# Patient Record
Sex: Female | Born: 1955 | Race: White | Hispanic: No | Marital: Single | Smoking: Never smoker
Health system: Southern US, Community
[De-identification: ages and names within clinical notes are randomized; demographics above are authoritative.]

## PROBLEM LIST (undated history)

## (undated) DIAGNOSIS — R51 Headache: Secondary | ICD-10-CM

## (undated) DIAGNOSIS — F419 Anxiety disorder, unspecified: Secondary | ICD-10-CM

## (undated) DIAGNOSIS — M353 Polymyalgia rheumatica: Secondary | ICD-10-CM

## (undated) DIAGNOSIS — G4730 Sleep apnea, unspecified: Secondary | ICD-10-CM

## (undated) DIAGNOSIS — T8859XA Other complications of anesthesia, initial encounter: Secondary | ICD-10-CM

## (undated) DIAGNOSIS — J45909 Unspecified asthma, uncomplicated: Secondary | ICD-10-CM

## (undated) DIAGNOSIS — Z9889 Other specified postprocedural states: Secondary | ICD-10-CM

## (undated) DIAGNOSIS — S63642A Sprain of metacarpophalangeal joint of left thumb, initial encounter: Secondary | ICD-10-CM

## (undated) DIAGNOSIS — S5332XA Traumatic rupture of left ulnar collateral ligament, initial encounter: Secondary | ICD-10-CM

## (undated) DIAGNOSIS — I499 Cardiac arrhythmia, unspecified: Secondary | ICD-10-CM

## (undated) DIAGNOSIS — Z8679 Personal history of other diseases of the circulatory system: Secondary | ICD-10-CM

## (undated) DIAGNOSIS — K219 Gastro-esophageal reflux disease without esophagitis: Secondary | ICD-10-CM

## (undated) DIAGNOSIS — M1990 Unspecified osteoarthritis, unspecified site: Secondary | ICD-10-CM

## (undated) DIAGNOSIS — T4145XA Adverse effect of unspecified anesthetic, initial encounter: Secondary | ICD-10-CM

## (undated) DIAGNOSIS — R519 Headache, unspecified: Secondary | ICD-10-CM

## (undated) HISTORY — PX: OTHER SURGICAL HISTORY: SHX169

## (undated) HISTORY — PX: NECK SURGERY: SHX720

## (undated) HISTORY — PX: BILATERAL CARPAL TUNNEL RELEASE: SHX6508

## (undated) HISTORY — PX: COLONOSCOPY: SHX174

## (undated) HISTORY — PX: NASAL SINUS SURGERY: SHX719

## (undated) HISTORY — PX: CHOLECYSTECTOMY: SHX55

---

## 2012-06-09 HISTORY — PX: JOINT REPLACEMENT: SHX530

## 2014-06-09 DIAGNOSIS — I499 Cardiac arrhythmia, unspecified: Secondary | ICD-10-CM

## 2014-06-09 HISTORY — DX: Cardiac arrhythmia, unspecified: I49.9

## 2014-06-09 HISTORY — PX: CARDIAC ELECTROPHYSIOLOGY STUDY AND ABLATION: SHX1294

## 2016-10-27 ENCOUNTER — Encounter (INDEPENDENT_AMBULATORY_CARE_PROVIDER_SITE_OTHER): Payer: Self-pay | Admitting: *Deleted

## 2016-11-05 ENCOUNTER — Encounter (INDEPENDENT_AMBULATORY_CARE_PROVIDER_SITE_OTHER): Payer: Self-pay

## 2016-11-05 ENCOUNTER — Other Ambulatory Visit (INDEPENDENT_AMBULATORY_CARE_PROVIDER_SITE_OTHER): Payer: Self-pay | Admitting: *Deleted

## 2016-11-05 ENCOUNTER — Encounter (INDEPENDENT_AMBULATORY_CARE_PROVIDER_SITE_OTHER): Payer: Self-pay | Admitting: *Deleted

## 2016-11-05 DIAGNOSIS — Z800 Family history of malignant neoplasm of digestive organs: Secondary | ICD-10-CM

## 2017-02-12 ENCOUNTER — Telehealth (INDEPENDENT_AMBULATORY_CARE_PROVIDER_SITE_OTHER): Payer: Self-pay | Admitting: *Deleted

## 2017-02-12 ENCOUNTER — Encounter (INDEPENDENT_AMBULATORY_CARE_PROVIDER_SITE_OTHER): Payer: Self-pay | Admitting: *Deleted

## 2017-02-12 NOTE — Telephone Encounter (Signed)
Patient needs trilyte 

## 2017-02-13 MED ORDER — PEG 3350-KCL-NA BICARB-NACL 420 G PO SOLR: 4000.0000 mL | Freq: Once | ORAL | 0 refills | Status: AC

## 2017-02-24 ENCOUNTER — Telehealth (INDEPENDENT_AMBULATORY_CARE_PROVIDER_SITE_OTHER): Payer: Self-pay | Admitting: *Deleted

## 2017-02-24 NOTE — Telephone Encounter (Signed)
Referring MD/PCP: pradhan   Procedure: tcs  Reason/Indication:  fam hx colon ca  Has patient had this procedure before?  Yes, 2013 -- scanned  If so, when, by whom and where?    Is there a family history of colon cancer?  Yes, father & sister  Who?  What age when diagnosed?    Is patient diabetic?   no      Does patient have prosthetic heart valve or mechanical valve?  no  Do you have a pacemaker?  no  Has patient ever had endocarditis? no  Has patient had joint replacement within last 12 months?  no  Does patient tend to be constipated or take laxatives? no  Does patient have a history of alcohol/drug use?  no  Is patient on Coumadin, Plavix and/or Aspirin? yes  Medications: asa 81 mg every other day, prednisone 10 mg daily, calcium daily, vit d daily  Allergies: latex, sulfur drugs, codeine, PCN, Augmentin, Levaquin, Z pac, Claritin, lasix, naprosyn, diltiazem  Medication Adjustment per Dr Laural Golden: asa 2 days  Procedure date & time: 03/18/17 at 930

## 2017-02-25 NOTE — Telephone Encounter (Signed)
agree

## 2017-03-18 ENCOUNTER — Ambulatory Visit (HOSPITAL_COMMUNITY)
Admission: RE | Admit: 2017-03-18 | Discharge: 2017-03-18 | Disposition: A | Discharge status: Home / Self Care | Payer: Medicare Other | Source: Ambulatory Visit | Attending: Internal Medicine | Admitting: Internal Medicine

## 2017-03-18 ENCOUNTER — Encounter (HOSPITAL_COMMUNITY)
Admission: RE | Disposition: A | Discharge status: Home / Self Care | Payer: Self-pay | Source: Ambulatory Visit | Attending: Internal Medicine

## 2017-03-18 ENCOUNTER — Encounter (HOSPITAL_COMMUNITY): Payer: Self-pay | Admitting: *Deleted

## 2017-03-18 DIAGNOSIS — Z7982 Long term (current) use of aspirin: Secondary | ICD-10-CM | POA: Insufficient documentation

## 2017-03-18 DIAGNOSIS — Z888 Allergy status to other drugs, medicaments and biological substances status: Secondary | ICD-10-CM | POA: Diagnosis not present

## 2017-03-18 DIAGNOSIS — Z791 Long term (current) use of non-steroidal anti-inflammatories (NSAID): Secondary | ICD-10-CM | POA: Diagnosis not present

## 2017-03-18 DIAGNOSIS — Z96651 Presence of right artificial knee joint: Secondary | ICD-10-CM | POA: Insufficient documentation

## 2017-03-18 DIAGNOSIS — Z79899 Other long term (current) drug therapy: Secondary | ICD-10-CM | POA: Diagnosis not present

## 2017-03-18 DIAGNOSIS — Z1211 Encounter for screening for malignant neoplasm of colon: Secondary | ICD-10-CM | POA: Insufficient documentation

## 2017-03-18 DIAGNOSIS — M353 Polymyalgia rheumatica: Secondary | ICD-10-CM | POA: Insufficient documentation

## 2017-03-18 DIAGNOSIS — Z880 Allergy status to penicillin: Secondary | ICD-10-CM | POA: Insufficient documentation

## 2017-03-18 DIAGNOSIS — D124 Benign neoplasm of descending colon: Secondary | ICD-10-CM | POA: Insufficient documentation

## 2017-03-18 DIAGNOSIS — Z800 Family history of malignant neoplasm of digestive organs: Secondary | ICD-10-CM

## 2017-03-18 DIAGNOSIS — F419 Anxiety disorder, unspecified: Secondary | ICD-10-CM | POA: Insufficient documentation

## 2017-03-18 DIAGNOSIS — I4891 Unspecified atrial fibrillation: Secondary | ICD-10-CM | POA: Diagnosis not present

## 2017-03-18 DIAGNOSIS — K219 Gastro-esophageal reflux disease without esophagitis: Secondary | ICD-10-CM | POA: Insufficient documentation

## 2017-03-18 HISTORY — DX: Cardiac arrhythmia, unspecified: I49.9

## 2017-03-18 HISTORY — DX: Headache, unspecified: R51.9

## 2017-03-18 HISTORY — DX: Other specified postprocedural states: Z98.890

## 2017-03-18 HISTORY — DX: Personal history of other diseases of the circulatory system: Z86.79

## 2017-03-18 HISTORY — DX: Unspecified asthma, uncomplicated: J45.909

## 2017-03-18 HISTORY — DX: Gastro-esophageal reflux disease without esophagitis: K21.9

## 2017-03-18 HISTORY — DX: Polymyalgia rheumatica: M35.3

## 2017-03-18 HISTORY — PX: COLONOSCOPY: SHX5424

## 2017-03-18 HISTORY — DX: Anxiety disorder, unspecified: F41.9

## 2017-03-18 HISTORY — DX: Headache: R51

## 2017-03-18 SURGERY — COLONOSCOPY
Anesthesia: Moderate Sedation

## 2017-03-18 MED ORDER — MIDAZOLAM HCL 5 MG/5ML IJ SOLN
INTRAMUSCULAR | Status: AC
  Filled 2017-03-18: qty 10

## 2017-03-18 MED ORDER — MEPERIDINE HCL 50 MG/ML IJ SOLN
INTRAMUSCULAR | Status: AC
  Filled 2017-03-18: qty 1

## 2017-03-18 MED ORDER — MEPERIDINE HCL 50 MG/ML IJ SOLN
INTRAMUSCULAR | Status: DC | PRN
  Administered 2017-03-18 (×2): 25 mg

## 2017-03-18 MED ORDER — MIDAZOLAM HCL 5 MG/5ML IJ SOLN
INTRAMUSCULAR | Status: DC | PRN
  Administered 2017-03-18 (×4): 2 mg via INTRAVENOUS

## 2017-03-18 MED ORDER — SODIUM CHLORIDE 0.9 % IV SOLN
INTRAVENOUS | Status: DC
  Administered 2017-03-18: 09:00:00 via INTRAVENOUS

## 2017-03-18 NOTE — Op Note (Signed)
[facility] Patient Name: Caitlin Barnes Procedure Date: 03/18/2017 8:58 AM MRN: [id number] Date of Birth: [birth year] Attending MD: Hildred Laser , MD CSN: [id number] Age: 61 Admit Type: Outpatient Procedure:                Colonoscopy Indications:              Screening in patient at increased risk: Colorectal                            cancer in father 63 or older, Screening in patient                            at increased risk: Colorectal cancer in sister                            before age 66 Providers:                Hildred Laser, MD, Janeece Riggers, RN, Rosina Lowenstein, RN Referring MD:             Tilden Fossa. Shon Millet, MD Medicines:                Meperidine 50 mg IV, Midazolam 8 mg IV Complications:            No immediate complications. Estimated Blood Loss:     Estimated blood loss was minimal. Procedure:                Pre-Anesthesia Assessment:                           - Prior to the procedure, a History and Physical                            was performed, and patient medications and                            allergies were reviewed. The patient's tolerance of                            previous anesthesia was also reviewed. The risks                            and benefits of the procedure and the sedation                            options and risks were discussed with the patient.                            All questions were answered, and informed consent                            was obtained. Prior Anticoagulants: The patient                            last took aspirin 2 days prior to the procedure.  ASA Grade Assessment: II - A patient with mild                            systemic disease. After reviewing the risks and                            benefits, the patient was deemed in satisfactory                            condition to undergo the procedure.                           After obtaining informed consent, the colonoscope                             was passed under direct vision. Throughout the                            procedure, the patient's blood pressure, pulse, and                            oxygen saturations were monitored continuously. The                            [id number] ([id number]) scope was introduced through                            the anus and advanced to the the cecum, identified                            by appendiceal orifice and ileocecal valve. The                            colonoscopy was performed without difficulty. The                            patient tolerated the procedure well. The quality                            of the bowel preparation was excellent. The                            ileocecal valve, appendiceal orifice, and rectum                            were photographed. Scope In: 9:28:00 AM Scope Out: 9:47:21 AM Scope Withdrawal Time: 0 hours 8 minutes 14 seconds  Total Procedure Duration: 0 hours 19 minutes 21 seconds  Findings:      The perianal and digital rectal examinations were normal.      A 5 mm polyp was found in the descending colon. The polyp was sessile.       The polyp was removed with a cold snare. Resection and retrieval were       complete.  The exam was otherwise normal throughout the examined colon.      The retroflexed view of the distal rectum and anal verge was normal and       showed no anal or rectal abnormalities. Impression:               - One 5 mm polyp in the descending colon, removed                            with a cold snare. Resected and retrieved. Moderate Sedation:      Moderate (conscious) sedation was administered by the endoscopy nurse       and supervised by the endoscopist. The following parameters were       monitored: oxygen saturation, heart rate, blood pressure, CO2       capnography and response to care. Total physician intraservice time was       27 minutes. Recommendation:           - Patient has a contact  number available for                            emergencies. The signs and symptoms of potential                            delayed complications were discussed with the                            patient. Return to normal activities tomorrow.                            Written discharge instructions were provided to the                            patient.                           - Resume previous diet today.                           - Continue present medications.                           - Resume aspirin at prior dose tomorrow.                           - Await pathology results.                           - Repeat colonoscopy in 5 years. Procedure Code(s):        --- Professional ---                           [phone], Colonoscopy, flexible; with removal of                            tumor(s), polyp(s), or other lesion(s) by snare  technique                           [phone], Moderate sedation services provided by the                            same physician or other qualified health care                            professional performing the diagnostic or                            therapeutic service that the sedation supports,                            requiring the presence of an independent trained                            observer to assist in the monitoring of the                            patient's level of consciousness and physiological                            status; initial 15 minutes of intraservice time,                            patient age 4 years or older                           [phone], Moderate sedation services; each additional                            15 minutes intraservice time Diagnosis Code(s):        --- Professional ---                           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 All rights reserved. The codes documented in this report are preliminary and upon coder review may  be revised to meet current compliance requirements. Hildred Laser, MD Hildred Laser, MD 03/18/2017 9:55:05 AM This report has been signed electronically. Number of Addenda: 0

## 2017-03-18 NOTE — H&P (Signed)
Caitlin Barnes is an 61 y.o. female.   Chief Complaint: Patient is here for colonoscopy. HPI: patient is 61 year old Caucasian female who is here for screening colonoscopy. She denies abdominal pain or rectal bleeding but has intermittent diarrhea. She says diarrhea started about a year after she had knee replacement 4 years ago. She has had gallbladder surgery. On most days her stool is formed. She also has seepage when stool is liquid. Family history significant for CRC and father who was around 37 at the time of diagnosis and is doing fine 22 years later. Sister was also diagnosed with colon carcinoma in her 20s and she is doing well 11 years later.  Past Medical History:  Diagnosis Date  . Anxiety   . Asthma due to seasonal allergies   . Dysrhythmia   . GERD (gastroesophageal reflux disease)   . H/O cardiac radiofrequency ablation   . Headache   . History of atrial fibrillation   . Polymyalgia rheumatica (HCC)     Past Surgical History:  Procedure Laterality Date  . BILATERAL CARPAL TUNNEL RELEASE    . CARDIAC ELECTROPHYSIOLOGY STUDY AND ABLATION    . CHOLECYSTECTOMY    . COLONOSCOPY    . COLONOSCOPY    . COLONOSCOPY    . JOINT REPLACEMENT  2014   Right knee replacement  . NASAL SINUS SURGERY    . NASAL SINUS SURGERY    . NECK SURGERY    . Right Carpal tunnel release    . Right knee arthroscopy    . Right knee arthroscopy    . Right knee fabella removal      Family History  Problem Relation Age of Onset  . Colon cancer Father   . Colon cancer Sister    Social History:  reports that she has never smoked. She has never used smokeless tobacco. She reports that she does not drink alcohol or use drugs.  Allergies:  Allergies  Allergen Reactions  . Augmentin [Amoxicillin-Pot Clavulanate] Swelling    Whole body  . Cantaloupe (Diagnostic) Other (See Comments)    Per allergy test   . Claritin [Loratadine] Itching  . Codeine Nausea And Vomiting  . Diltiazem Other (See  Comments)    Really low BP  . Levaquin [Levofloxacin In D5w] Swelling  . Other     Tree nuts Per allergy test  . Penicillins Swelling    Has patient had a PCN reaction causing immediate rash, facial/tongue/throat swelling, SOB or lightheadedness with hypotension: unknown Has patient had a PCN reaction causing severe rash involving mucus membranes or skin necrosis:unknown Has patient had a PCN reaction occurring within the last 10 years: unknown If all of the above answers are "NO", then may proceed with Cephalosporin use.   . Shellfish Allergy Other (See Comments)    Per allergy test  . Strawberry Flavor Other (See Comments)    Per allergy test  . Sulfa Antibiotics Other (See Comments)    Pt does not remember   . Azithromycin Palpitations  . Biaxin [Clarithromycin] Palpitations  . Gluten Meal Diarrhea and Rash  . Lasix [Furosemide] Rash  . Latex Rash  . Naprosyn [Naproxen] Rash    Medications Prior to Admission  Medication Sig Dispense Refill  . acetaminophen (TYLENOL 8 HOUR) 650 MG CR tablet Take 650 mg by mouth daily as needed for pain.    Marland Kitchen aspirin EC 81 MG tablet Take 81 mg by mouth every other day.    . Calcium Carb-Cholecalciferol (CALCIUM 1000 + D  PO) Take 1 tablet by mouth daily.    . cetirizine (ZYRTEC) 10 MG tablet Take 10 mg by mouth daily as needed for allergies.    . Mupirocin POWD 1 Dose by Does not apply route daily.    . predniSONE (DELTASONE) 10 MG tablet Take 10 mg by mouth daily with breakfast.    . sodium chloride (OCEAN) 0.65 % SOLN nasal spray Place 1 spray into both nostrils as needed for congestion.    Marland Kitchen albuterol (PROVENTIL HFA;VENTOLIN HFA) 108 (90 Base) MCG/ACT inhaler Inhale into the lungs every 6 (six) hours as needed for wheezing or shortness of breath.    Marland Kitchen albuterol (PROVENTIL) (2.5 MG/3ML) 0.083% nebulizer solution Take 2.5 mg by nebulization daily as needed for wheezing or shortness of breath.      No results found for this or any previous  visit (from the past 48 hour(s)). No results found.  ROS  Blood pressure 138/77, pulse 81, temperature (!) 97.5 F (36.4 C), temperature source Oral, resp. rate 18, height 5\' 5"  (1.651 m), weight 153 lb (69.4 kg), SpO2 99 %. Physical Exam  Constitutional: She appears well-developed and well-nourished.  HENT:  Mouth/Throat: Oropharynx is clear and moist.  Eyes: Conjunctivae are normal. No scleral icterus.  Neck: No thyromegaly present.  Cardiovascular: Normal rate, regular rhythm and normal heart sounds.   No murmur heard. Respiratory: Effort normal and breath sounds normal.  GI: Soft. She exhibits no distension and no mass. There is no tenderness.  Musculoskeletal: She exhibits no edema.  Lymphadenopathy:    She has no cervical adenopathy.  Neurological: She is alert.  Skin: Skin is warm and dry.     Assessment/Plan High risk screening colonoscopy  Hildred Laser, MD 03/18/2017, 9:14 AM

## 2017-03-18 NOTE — Discharge Instructions (Signed)
Resume aspirin on 03/19/2017. Resume other medications as before. Resume usual diet. No driving for 24 hours. Physician will call with biopsy results. Next colonoscopy in 5 years.     Colonoscopy, Adult, Care After This sheet gives you information about how to care for yourself after your procedure. Your doctor may also give you more specific instructions. If you have problems or questions, call your doctor. Follow these instructions at home: General instructions   For the first 24 hours after the procedure: ? Do not drive or use machinery. ? Do not sign important documents. ? Do not drink alcohol. ? Do your daily activities more slowly than normal. ? Eat foods that are soft and easy to digest. ? Rest often.  Take over-the-counter or prescription medicines only as told by your doctor.  It is up to you to get the results of your procedure. Ask your doctor, or the department performing the procedure, when your results will be ready. To help cramping and bloating:  Try walking around.  Put heat on your belly (abdomen) as told by your doctor. Use a heat source that your doctor recommends, such as a moist heat pack or a heating pad. ? Put a towel between your skin and the heat source. ? Leave the heat on for 20-30 minutes. ? Remove the heat if your skin turns bright red. This is especially important if you cannot feel pain, heat, or cold. You can get burned. Eating and drinking  Drink enough fluid to keep your pee (urine) clear or pale yellow.  Return to your normal diet as told by your doctor. Avoid heavy or fried foods that are hard to digest.  Avoid drinking alcohol for as long as told by your doctor. Contact a doctor if:  You have blood in your poop (stool) 2-3 days after the procedure. Get help right away if:  You have more than a small amount of blood in your poop.  You see large clumps of tissue (blood clots) in your poop.  Your belly is swollen.  You feel sick  to your stomach (nauseous).  You throw up (vomit).  You have a fever.  You have belly pain that gets worse, and medicine does not help your pain.    Colon Polyps Polyps are tissue growths inside the body. Polyps can grow in many places, including the large intestine (colon). A polyp may be a round bump or a mushroom-shaped growth. You could have one polyp or several. Most colon polyps are noncancerous (benign). However, some colon polyps can become cancerous over time. What are the causes? The exact cause of colon polyps is not known. What increases the risk? This condition is more likely to develop in people who:  Have a family history of colon cancer or colon polyps.  Are older than 55 or older than 45 if they are African American.  Have inflammatory bowel disease, such as ulcerative colitis or Crohn disease.  Are overweight.  Smoke cigarettes.  Do not get enough exercise.  Drink too much alcohol.  Eat a diet that is: ? High in fat and red meat. ? Low in fiber.  Had childhood cancer that was treated with abdominal radiation.  What are the signs or symptoms? Most polyps do not cause symptoms. If you have symptoms, they may include:  Blood coming from your rectum when having a bowel movement.  Blood in your stool.The stool may look dark red or black.  A change in bowel habits, such as constipation  or diarrhea.  How is this diagnosed? This condition is diagnosed with a colonoscopy. This is a procedure that uses a lighted, flexible scope to look at the inside of your colon. How is this treated? Treatment for this condition involves removing any polyps that are found. Those polyps will then be tested for cancer. If cancer is found, your health care provider will talk to you about options for colon cancer treatment. Follow these instructions at home: Diet  Eat plenty of fiber, such as fruits, vegetables, and whole grains.  Eat foods that are high in calcium and  vitamin D, such as milk, cheese, yogurt, eggs, liver, fish, and broccoli.  Limit foods high in fat, red meats, and processed meats, such as hot dogs, sausage, bacon, and lunch meats.  Maintain a healthy weight, or lose weight if recommended by your health care provider. General instructions  Do not smoke cigarettes.  Do not drink alcohol excessively.  Keep all follow-up visits as told by your health care provider. This is important. This includes keeping regularly scheduled colonoscopies. Talk to your health care provider about when you need a colonoscopy.  Exercise every day or as told by your health care provider. Contact a health care provider if:  You have new or worsening bleeding during a bowel movement.  You have new or increased blood in your stool.  You have a change in bowel habits.  You unexpectedly lose weight. This information is not intended to replace advice given to you by your health care provider. Make sure you discuss any questions you have with your health care provider. Document Released: 02/20/2004 Document Revised: 11/01/2015 Document Reviewed: 04/16/2015 Elsevier Interactive Patient Education  [employer].

## 2017-03-23 ENCOUNTER — Encounter (HOSPITAL_COMMUNITY): Payer: Self-pay | Admitting: Internal Medicine

## 2017-07-28 ENCOUNTER — Other Ambulatory Visit: Payer: Self-pay | Admitting: Orthopedic Surgery

## 2017-07-28 DIAGNOSIS — S5331XA Traumatic rupture of right ulnar collateral ligament, initial encounter: Secondary | ICD-10-CM

## 2017-08-10 ENCOUNTER — Other Ambulatory Visit: Payer: Medicare Other

## 2017-08-10 ENCOUNTER — Inpatient Hospital Stay
Admission: RE | Admit: 2017-08-10 | Discharge: 2017-08-10 | Disposition: A | Discharge status: Home / Self Care | Payer: Medicare Other | Source: Ambulatory Visit | Attending: Orthopedic Surgery | Admitting: Orthopedic Surgery

## 2017-08-21 ENCOUNTER — Ambulatory Visit
Admission: RE | Admit: 2017-08-21 | Discharge: 2017-08-21 | Disposition: A | Discharge status: Home / Self Care | Payer: Medicare Other | Source: Ambulatory Visit | Attending: Orthopedic Surgery | Admitting: Orthopedic Surgery

## 2017-08-21 ENCOUNTER — Other Ambulatory Visit: Payer: Self-pay | Admitting: Orthopedic Surgery

## 2017-08-21 DIAGNOSIS — S5331XA Traumatic rupture of right ulnar collateral ligament, initial encounter: Secondary | ICD-10-CM

## 2017-08-21 MED ORDER — IOPAMIDOL (ISOVUE-M 200) INJECTION 41%
2.0000 mL | Freq: Once | INTRAMUSCULAR | Status: AC
  Administered 2017-08-21: 2 mL via INTRA_ARTICULAR

## 2017-09-01 ENCOUNTER — Other Ambulatory Visit: Payer: Self-pay

## 2017-09-01 ENCOUNTER — Other Ambulatory Visit: Payer: Self-pay | Admitting: Orthopedic Surgery

## 2017-09-01 ENCOUNTER — Encounter (HOSPITAL_BASED_OUTPATIENT_CLINIC_OR_DEPARTMENT_OTHER): Payer: Self-pay | Admitting: *Deleted

## 2017-09-03 ENCOUNTER — Encounter (HOSPITAL_BASED_OUTPATIENT_CLINIC_OR_DEPARTMENT_OTHER): Payer: Self-pay | Admitting: Anesthesiology

## 2017-09-03 ENCOUNTER — Encounter (HOSPITAL_BASED_OUTPATIENT_CLINIC_OR_DEPARTMENT_OTHER)
Admission: RE | Disposition: A | Discharge status: Home / Self Care | Payer: Self-pay | Source: Ambulatory Visit | Attending: Orthopedic Surgery

## 2017-09-03 ENCOUNTER — Ambulatory Visit (HOSPITAL_BASED_OUTPATIENT_CLINIC_OR_DEPARTMENT_OTHER)
Admission: RE | Admit: 2017-09-03 | Discharge: 2017-09-03 | Disposition: A | Discharge status: Home / Self Care | Payer: Medicare Other | Source: Ambulatory Visit | Attending: Orthopedic Surgery | Admitting: Orthopedic Surgery

## 2017-09-03 ENCOUNTER — Ambulatory Visit (HOSPITAL_BASED_OUTPATIENT_CLINIC_OR_DEPARTMENT_OTHER): Payer: Medicare Other | Admitting: Anesthesiology

## 2017-09-03 ENCOUNTER — Other Ambulatory Visit: Payer: Self-pay

## 2017-09-03 DIAGNOSIS — G4733 Obstructive sleep apnea (adult) (pediatric): Secondary | ICD-10-CM | POA: Insufficient documentation

## 2017-09-03 DIAGNOSIS — Z96651 Presence of right artificial knee joint: Secondary | ICD-10-CM | POA: Diagnosis not present

## 2017-09-03 DIAGNOSIS — Z91013 Allergy to seafood: Secondary | ICD-10-CM | POA: Diagnosis not present

## 2017-09-03 DIAGNOSIS — S63642A Sprain of metacarpophalangeal joint of left thumb, initial encounter: Secondary | ICD-10-CM | POA: Diagnosis not present

## 2017-09-03 DIAGNOSIS — K219 Gastro-esophageal reflux disease without esophagitis: Secondary | ICD-10-CM | POA: Diagnosis not present

## 2017-09-03 DIAGNOSIS — M353 Polymyalgia rheumatica: Secondary | ICD-10-CM | POA: Diagnosis not present

## 2017-09-03 DIAGNOSIS — R51 Headache: Secondary | ICD-10-CM | POA: Diagnosis not present

## 2017-09-03 DIAGNOSIS — Z882 Allergy status to sulfonamides status: Secondary | ICD-10-CM | POA: Diagnosis not present

## 2017-09-03 DIAGNOSIS — Z91040 Latex allergy status: Secondary | ICD-10-CM | POA: Insufficient documentation

## 2017-09-03 DIAGNOSIS — Z91018 Allergy to other foods: Secondary | ICD-10-CM | POA: Diagnosis not present

## 2017-09-03 DIAGNOSIS — Z79899 Other long term (current) drug therapy: Secondary | ICD-10-CM | POA: Insufficient documentation

## 2017-09-03 DIAGNOSIS — X58XXXA Exposure to other specified factors, initial encounter: Secondary | ICD-10-CM | POA: Diagnosis not present

## 2017-09-03 DIAGNOSIS — Z888 Allergy status to other drugs, medicaments and biological substances status: Secondary | ICD-10-CM | POA: Diagnosis not present

## 2017-09-03 DIAGNOSIS — M1990 Unspecified osteoarthritis, unspecified site: Secondary | ICD-10-CM | POA: Diagnosis not present

## 2017-09-03 DIAGNOSIS — Z885 Allergy status to narcotic agent status: Secondary | ICD-10-CM | POA: Insufficient documentation

## 2017-09-03 DIAGNOSIS — Z886 Allergy status to analgesic agent status: Secondary | ICD-10-CM | POA: Insufficient documentation

## 2017-09-03 DIAGNOSIS — Y9389 Activity, other specified: Secondary | ICD-10-CM | POA: Insufficient documentation

## 2017-09-03 DIAGNOSIS — I4891 Unspecified atrial fibrillation: Secondary | ICD-10-CM | POA: Insufficient documentation

## 2017-09-03 DIAGNOSIS — F419 Anxiety disorder, unspecified: Secondary | ICD-10-CM | POA: Insufficient documentation

## 2017-09-03 DIAGNOSIS — Z880 Allergy status to penicillin: Secondary | ICD-10-CM | POA: Insufficient documentation

## 2017-09-03 DIAGNOSIS — J45909 Unspecified asthma, uncomplicated: Secondary | ICD-10-CM | POA: Diagnosis not present

## 2017-09-03 DIAGNOSIS — Z7982 Long term (current) use of aspirin: Secondary | ICD-10-CM | POA: Insufficient documentation

## 2017-09-03 DIAGNOSIS — Z9049 Acquired absence of other specified parts of digestive tract: Secondary | ICD-10-CM | POA: Insufficient documentation

## 2017-09-03 DIAGNOSIS — Z800 Family history of malignant neoplasm of digestive organs: Secondary | ICD-10-CM | POA: Diagnosis not present

## 2017-09-03 DIAGNOSIS — Z881 Allergy status to other antibiotic agents status: Secondary | ICD-10-CM | POA: Insufficient documentation

## 2017-09-03 HISTORY — PX: ULNAR COLLATERAL LIGAMENT REPAIR: SHX6159

## 2017-09-03 HISTORY — DX: Sleep apnea, unspecified: G47.30

## 2017-09-03 HISTORY — DX: Unspecified osteoarthritis, unspecified site: M19.90

## 2017-09-03 HISTORY — DX: Sprain of metacarpophalangeal joint of left thumb, initial encounter: S63.642A

## 2017-09-03 HISTORY — DX: Traumatic rupture of left ulnar collateral ligament, initial encounter: S53.32XA

## 2017-09-03 HISTORY — DX: Other complications of anesthesia, initial encounter: T88.59XA

## 2017-09-03 HISTORY — DX: Adverse effect of unspecified anesthetic, initial encounter: T41.45XA

## 2017-09-03 SURGERY — REPAIR, LIGAMENT, ULNAR COLLATERAL
Anesthesia: Regional | Site: Hand | Laterality: Left

## 2017-09-03 MED ORDER — LACTATED RINGERS IV SOLN
INTRAVENOUS | Status: DC
  Administered 2017-09-03: 12:00:00 via INTRAVENOUS

## 2017-09-03 MED ORDER — PROPOFOL 500 MG/50ML IV EMUL
INTRAVENOUS | Status: DC | PRN
  Administered 2017-09-03: 75 ug/kg/min via INTRAVENOUS

## 2017-09-03 MED ORDER — CLINDAMYCIN PHOSPHATE 900 MG/50ML IV SOLN: 900.0000 mg | INTRAVENOUS | Status: DC

## 2017-09-03 MED ORDER — MIDAZOLAM HCL 2 MG/2ML IJ SOLN
1.0000 mg | INTRAMUSCULAR | Status: DC | PRN
  Administered 2017-09-03: 2 mg via INTRAVENOUS

## 2017-09-03 MED ORDER — BUPIVACAINE-EPINEPHRINE (PF) 0.5% -1:200000 IJ SOLN
INTRAMUSCULAR | Status: DC | PRN
  Administered 2017-09-03: 30 mL via PERINEURAL

## 2017-09-03 MED ORDER — FENTANYL CITRATE (PF) 100 MCG/2ML IJ SOLN
INTRAMUSCULAR | Status: AC
  Filled 2017-09-03: qty 2

## 2017-09-03 MED ORDER — SCOPOLAMINE 1 MG/3DAYS TD PT72: 1.0000 | MEDICATED_PATCH | Freq: Once | TRANSDERMAL | Status: DC | PRN

## 2017-09-03 MED ORDER — PROPOFOL 10 MG/ML IV BOLUS
INTRAVENOUS | Status: AC
  Filled 2017-09-03: qty 20

## 2017-09-03 MED ORDER — DEXAMETHASONE SODIUM PHOSPHATE 10 MG/ML IJ SOLN
INTRAMUSCULAR | Status: DC | PRN
  Administered 2017-09-03: 10 mg via INTRAVENOUS

## 2017-09-03 MED ORDER — ONDANSETRON HCL 4 MG/2ML IJ SOLN
INTRAMUSCULAR | Status: AC
  Filled 2017-09-03: qty 2

## 2017-09-03 MED ORDER — MIDAZOLAM HCL 2 MG/2ML IJ SOLN
INTRAMUSCULAR | Status: AC
  Filled 2017-09-03: qty 2

## 2017-09-03 MED ORDER — PROPOFOL 500 MG/50ML IV EMUL
INTRAVENOUS | Status: AC
  Filled 2017-09-03: qty 50

## 2017-09-03 MED ORDER — OXYCODONE-ACETAMINOPHEN 5-325 MG PO TABS: 1.0000 | ORAL_TABLET | ORAL | 0 refills | Status: AC | PRN

## 2017-09-03 MED ORDER — CHLORHEXIDINE GLUCONATE 4 % EX LIQD: 60.0000 mL | Freq: Once | CUTANEOUS | Status: DC

## 2017-09-03 MED ORDER — 0.9 % SODIUM CHLORIDE (POUR BTL) OPTIME
TOPICAL | Status: DC | PRN
  Administered 2017-09-03: 200 mL

## 2017-09-03 MED ORDER — ONDANSETRON HCL 4 MG/2ML IJ SOLN
INTRAMUSCULAR | Status: DC | PRN
  Administered 2017-09-03: 4 mg via INTRAVENOUS

## 2017-09-03 MED ORDER — PROPOFOL 10 MG/ML IV BOLUS
INTRAVENOUS | Status: DC | PRN
  Administered 2017-09-03: 20 mg via INTRAVENOUS

## 2017-09-03 MED ORDER — DEXAMETHASONE SODIUM PHOSPHATE 10 MG/ML IJ SOLN
INTRAMUSCULAR | Status: AC
  Filled 2017-09-03: qty 1

## 2017-09-03 MED ORDER — LIDOCAINE HCL (CARDIAC) 20 MG/ML IV SOLN
INTRAVENOUS | Status: AC
  Filled 2017-09-03: qty 5

## 2017-09-03 MED ORDER — FENTANYL CITRATE (PF) 100 MCG/2ML IJ SOLN
50.0000 ug | INTRAMUSCULAR | Status: DC | PRN
  Administered 2017-09-03: 50 ug via INTRAVENOUS

## 2017-09-03 MED ORDER — CLINDAMYCIN PHOSPHATE 900 MG/50ML IV SOLN
900.0000 mg | INTRAVENOUS | Status: AC
  Administered 2017-09-03: 900 mg via INTRAVENOUS
  Filled 2017-09-03: qty 50

## 2017-09-03 SURGICAL SUPPLY — 72 items
ANCHOR JUGGERKNOT 1.0 3-0 NLD (Anchor) ×3 IMPLANT
BAG DECANTER FOR FLEXI CONT (MISCELLANEOUS) IMPLANT
BLADE MINI RND TIP GREEN BEAV (BLADE) ×3 IMPLANT
BLADE SURG 15 STRL LF DISP TIS (BLADE) ×1 IMPLANT
BLADE SURG 15 STRL SS (BLADE) ×2
BNDG COHESIVE 3X5 TAN STRL LF (GAUZE/BANDAGES/DRESSINGS) ×3 IMPLANT
BNDG ESMARK 4X9 LF (GAUZE/BANDAGES/DRESSINGS) ×3 IMPLANT
BNDG GAUZE ELAST 4 BULKY (GAUZE/BANDAGES/DRESSINGS) ×3 IMPLANT
CHLORAPREP W/TINT 26ML (MISCELLANEOUS) ×3 IMPLANT
CORD BIPOLAR FORCEPS 12FT (ELECTRODE) ×3 IMPLANT
COVER BACK TABLE 60X90IN (DRAPES) ×3 IMPLANT
COVER MAYO STAND STRL (DRAPES) ×3 IMPLANT
CUFF TOURNIQUET SINGLE 18IN (TOURNIQUET CUFF) ×3 IMPLANT
DECANTER SPIKE VIAL GLASS SM (MISCELLANEOUS) IMPLANT
DRAPE EXTREMITY T 121X128X90 (DRAPE) ×3 IMPLANT
DRAPE OEC MINIVIEW 54X84 (DRAPES) ×3 IMPLANT
DRAPE SURG 17X23 STRL (DRAPES) ×3 IMPLANT
GAUZE SPONGE 4X4 12PLY STRL (GAUZE/BANDAGES/DRESSINGS) ×3 IMPLANT
GAUZE XEROFORM 1X8 LF (GAUZE/BANDAGES/DRESSINGS) ×3 IMPLANT
GLOVE BIOGEL PI IND STRL 7.0 (GLOVE) ×2 IMPLANT
GLOVE BIOGEL PI IND STRL 7.5 (GLOVE) ×1 IMPLANT
GLOVE BIOGEL PI IND STRL 8.5 (GLOVE) ×1 IMPLANT
GLOVE BIOGEL PI INDICATOR 7.0 (GLOVE) ×4
GLOVE BIOGEL PI INDICATOR 7.5 (GLOVE) ×2
GLOVE BIOGEL PI INDICATOR 8.5 (GLOVE) ×2
GLOVE SURG ORTHO 8.0 STRL STRW (GLOVE) IMPLANT
GLOVE SURG SS PI 6.5 STRL IVOR (GLOVE) ×3 IMPLANT
GLOVE SURG SS PI 7.5 STRL IVOR (GLOVE) ×3 IMPLANT
GLOVE SURG SS PI 8.0 STRL IVOR (GLOVE) ×3 IMPLANT
GLOVE SURG SYN 7.5  E (GLOVE) ×2
GLOVE SURG SYN 7.5 E (GLOVE) ×1 IMPLANT
GOWN STRL REUS W/ TWL LRG LVL3 (GOWN DISPOSABLE) ×1 IMPLANT
GOWN STRL REUS W/TWL LRG LVL3 (GOWN DISPOSABLE) ×2
GOWN STRL REUS W/TWL XL LVL3 (GOWN DISPOSABLE) ×6 IMPLANT
K-WIRE .035X4 (WIRE) IMPLANT
NDL SAFETY ECLIPSE 18X1.5 (NEEDLE) IMPLANT
NEEDLE FISTULA 1/2 CIRCLE (NEEDLE) IMPLANT
NEEDLE HYPO 18GX1.5 SHARP (NEEDLE)
NEEDLE KEITH (NEEDLE) IMPLANT
NEEDLE KEITH SZ10 STRAIGHT (NEEDLE) IMPLANT
NEEDLE PRECISIONGLIDE 27X1.5 (NEEDLE) IMPLANT
NS IRRIG 1000ML POUR BTL (IV SOLUTION) ×3 IMPLANT
PACK BASIN DAY SURGERY FS (CUSTOM PROCEDURE TRAY) ×3 IMPLANT
PAD CAST 3X4 CTTN HI CHSV (CAST SUPPLIES) ×1 IMPLANT
PAD CAST 4YDX4 CTTN HI CHSV (CAST SUPPLIES) IMPLANT
PADDING CAST ABS 4INX4YD NS (CAST SUPPLIES)
PADDING CAST ABS COTTON 4X4 ST (CAST SUPPLIES) IMPLANT
PADDING CAST COTTON 3X4 STRL (CAST SUPPLIES) ×2
PADDING CAST COTTON 4X4 STRL (CAST SUPPLIES)
PASSER SUT SWANSON 36MM LOOP (INSTRUMENTS) IMPLANT
SLEEVE SCD COMPRESS KNEE MED (MISCELLANEOUS) ×3 IMPLANT
SLING ARM FOAM STRAP LRG (SOFTGOODS) ×3 IMPLANT
SPLINT PLASTER CAST XFAST 3X15 (CAST SUPPLIES) IMPLANT
SPLINT PLASTER XTRA FASTSET 3X (CAST SUPPLIES)
STOCKINETTE 4X48 STRL (DRAPES) ×3 IMPLANT
SUT ETHIBOND 3-0 V-5 (SUTURE) IMPLANT
SUT ETHILON 4 0 PS 2 18 (SUTURE) ×3 IMPLANT
SUT FIBERWIRE 2-0 18 17.9 3/8 (SUTURE)
SUT FIBERWIRE 3-0 18 TAPR NDL (SUTURE)
SUT MERSILENE 2.0 SH NDLE (SUTURE) IMPLANT
SUT MERSILENE 4 0 P 3 (SUTURE) IMPLANT
SUT MERSILENE 5 0 P 3 (SUTURE) ×3 IMPLANT
SUT SILK 4 0 PS 2 (SUTURE) IMPLANT
SUT STEEL 3 0 (SUTURE) IMPLANT
SUT STEEL 4 0 (SUTURE) IMPLANT
SUT VICRYL 4-0 PS2 18IN ABS (SUTURE) IMPLANT
SUTURE FIBERWR 2-0 18 17.9 3/8 (SUTURE) IMPLANT
SUTURE FIBERWR 3-0 18 TAPR NDL (SUTURE) IMPLANT
SYR BULB 3OZ (MISCELLANEOUS) ×3 IMPLANT
SYR CONTROL 10ML LL (SYRINGE) IMPLANT
TOWEL OR 17X24 6PK STRL BLUE (TOWEL DISPOSABLE) ×6 IMPLANT
UNDERPAD 30X30 (UNDERPADS AND DIAPERS) ×3 IMPLANT

## 2017-09-03 NOTE — Progress Notes (Signed)
Assisted Dr. Turk with left, ultrasound guided, supraclavicular block. Side rails up, monitors on throughout procedure. See vital signs in flow sheet. Tolerated Procedure well. 

## 2017-09-03 NOTE — Anesthesia Postprocedure Evaluation (Signed)
Anesthesia Post Note  Patient: Caitlin Barnes  Procedure(s) Performed: REPAIR/RECONSTRUCTION ULNA COLLATERAL LIGAMENT METACARPAL LEFT THUMB (Left Hand)     Patient location during evaluation: PACU Anesthesia Type: Regional Level of consciousness: awake and alert Pain management: pain level controlled Vital Signs Assessment: post-procedure vital signs reviewed and stable Respiratory status: spontaneous breathing, nonlabored ventilation and respiratory function stable Cardiovascular status: stable and blood pressure returned to baseline Postop Assessment: no apparent nausea or vomiting Anesthetic complications: no    Last Vitals:  Vitals:   09/03/17 1415 09/03/17 1437  BP: 126/74 139/74  Pulse:  79  Resp:  18  Temp:  36.6 C  SpO2:  95%    Last Pain:  Vitals:   09/03/17 1437  TempSrc: Oral  PainSc: 0-No pain                 Catalina Gravel

## 2017-09-03 NOTE — Transfer of Care (Signed)
Immediate Anesthesia Transfer of Care Note  Patient: Caitlin Barnes  Procedure(s) Performed: REPAIR/RECONSTRUCTION ULNA COLLATERAL LIGAMENT METACARPAL (Left Hand)  Patient Location: PACU  Anesthesia Type:MAC combined with regional for post-op pain  Level of Consciousness: awake, alert  and oriented  Airway & Oxygen Therapy: Patient Spontanous Breathing and Patient connected to face mask oxygen  Post-op Assessment: Report given to RN and Post -op Vital signs reviewed and stable  Post vital signs: Reviewed and stable  Last Vitals:  Vitals Value Taken Time  BP    Temp    Pulse 83 09/03/2017  1:53 PM  Resp 9 09/03/2017  1:53 PM  SpO2 100 % 09/03/2017  1:53 PM  Vitals shown include unvalidated device data.  Last Pain:  Vitals:   09/03/17 1138  TempSrc: Oral  PainSc: 1       Patients Stated Pain Goal: 4 (97/35/32 [id number])  Complications: No apparent anesthesia complications

## 2017-09-03 NOTE — Op Note (Signed)
Other Dictation: Dictation Number [id number]

## 2017-09-03 NOTE — Op Note (Signed)
I assisted Surgeon(s) and Role:    * Daryll Brod, MD - Primary    Leanora Cover, MD - Assisting on the Procedure(s): REPAIR/RECONSTRUCTION [place] on 09/03/2017.  I provided assistance on this case as follows: retraction soft tissues, positioning of thumb, placement suture anchor.  Electronically signed by: Tennis Must, MD Date: 09/03/2017 Time: 1:45 PM

## 2017-09-03 NOTE — Brief Op Note (Signed)
09/03/2017  1:44 PM  PATIENT:  Caitlin Barnes  62 y.o. female  PRE-OPERATIVE DIAGNOSIS:  GAME KEEPER LEFT THUMB  POST-OPERATIVE DIAGNOSIS:  GAME KEEPER LEFT THUMB  PROCEDURE:  Procedure(s) with comments: REPAIR/RECONSTRUCTION ULNA COLLATERAL LIGAMENT METACARPAL LEFT THUMB ABDUCTOR POLLICIS LONGUS TRANSFER (Left) - AXILLARY BLOCK  SURGEON:  Surgeon(s) and Role:    * Daryll Brod, MD - Primary    * Leanora Cover, MD - Assisting  PHYSICIAN ASSISTANT:   ASSISTANTS: K Amairany Schumpert,MD   ANESTHESIA:   regional and IV sedation  EBL:  19ml   BLOOD ADMINISTERED:none  DRAINS: none   LOCAL MEDICATIONS USED:  NONE  SPECIMEN:  No Specimen  DISPOSITION OF SPECIMEN:  N/A  COUNTS:  YES  TOURNIQUET:  * Missing tourniquet times found for documented tourniquets in log: [id number] *  DICTATION: .Other Dictation: Dictation Number [id number]  PLAN OF CARE: Discharge to home after PACU  PATIENT DISPOSITION:  PACU - hemodynamically stable.

## 2017-09-03 NOTE — Discharge Instructions (Addendum)

## 2017-09-03 NOTE — Anesthesia Procedure Notes (Signed)
Anesthesia Regional Block: Supraclavicular block   Pre-Anesthetic Checklist: ,, timeout performed, Correct Patient, Correct Site, Correct Laterality, Correct Procedure, Correct Position, site marked, Risks and benefits discussed,  Surgical consent,  Pre-op evaluation,  At surgeon's request and post-op pain management  Laterality: Left  Prep: chloraprep       Needles:  Injection technique: Single-shot  Needle Type: Echogenic Needle     Needle Length: 9cm  Needle Gauge: 21     Additional Needles:   Procedures:,,,, ultrasound used (permanent image in chart),,,,  Narrative:  Start time: 09/03/2017 12:17 PM End time: 09/03/2017 12:22 PM Injection made incrementally with aspirations every 5 mL.  Performed by: Personally  Anesthesiologist: Catalina Gravel, MD  Additional Notes: No pain on injection. No increased resistance to injection. Injection made in 5cc increments.  Good needle visualization.  Patient tolerated procedure well.

## 2017-09-03 NOTE — Anesthesia Preprocedure Evaluation (Addendum)
Anesthesia Evaluation  Patient identified by MRN, date of birth, ID band Patient awake    Reviewed: Allergy & Precautions, NPO status , Patient's Chart, lab work & pertinent test results  Airway Mallampati: II  TM Distance: >3 FB Neck ROM: Full    Dental  (+) Teeth Intact, Dental Advisory Given   Pulmonary asthma , sleep apnea (wears mouth guard) ,    Pulmonary exam normal breath sounds clear to auscultation       Cardiovascular negative cardio ROS Normal cardiovascular exam+ dysrhythmias (s/p ablation) Atrial Fibrillation  Rhythm:Regular Rate:Normal     Neuro/Psych PSYCHIATRIC DISORDERS Anxiety negative neurological ROS     GI/Hepatic negative GI ROS, Neg liver ROS,   Endo/Other  negative endocrine ROS  Renal/GU negative Renal ROS     Musculoskeletal Polymyalgia rheumatica    Abdominal   Peds  Hematology negative hematology ROS (+)   Anesthesia Other Findings Day of surgery medications reviewed with the patient.  Reproductive/Obstetrics                             Anesthesia Physical Anesthesia Plan  ASA: III  Anesthesia Plan: MAC and Regional   Post-op Pain Management:    Induction: Intravenous  PONV Risk Score and Plan: 2 and Midazolam, Propofol infusion and Treatment may vary due to age or medical condition  Airway Management Planned: Simple Face Mask and Natural Airway  Additional Equipment:   Intra-op Plan:   Post-operative Plan:   Informed Consent: I have reviewed the patients History and Physical, chart, labs and discussed the procedure including the risks, benefits and alternatives for the proposed anesthesia with the patient or authorized representative who has indicated his/her understanding and acceptance.   Dental advisory given  Plan Discussed with: CRNA  Anesthesia Plan Comments: (Block plus MAC. Backup GA with LMA.)       Anesthesia Quick  Evaluation

## 2017-09-03 NOTE — H&P (Signed)
Caitlin Barnes is an 62 y.o. female.   Chief Complaint: pain left thumb MCP jointHPI: Caitlin Barnes is a 62 year old hand dominant female referred by Dr. Chauncey Reading for consultation regarding her left thumb. She complains of sharp pain at the metacarpal phalangeal joint ulnar aspect with a VAS score 10/10. This occurs when she is using her thumb. She states that she sustained an injury when she was pressing thing out of a container. She felt a pop she has been placed in a splint by Dr. Chauncey Reading. She has not been taking anything for it other than Tylenol and due to multiple allergies. She has been on prednisone. This is now 87 months old. She was  referred for MRI to assess the collateral ligament. This has been done and read out by Dr. Candise Che. This was arthrographic in nature revealing a tear of the dorsal capsule and lengthening of the ulnar collateral ligament She has a history of arthritis no history of diabetes thyroid problems or gout. Family history is positive for each of these.      Past Medical History:  Diagnosis Date  . Anxiety   . Arthritis   . Asthma due to seasonal allergies   . Complication of anesthesia    hard time waking up  . Dysrhythmia 2016   ablation for a-fib in [place], no meds now  . Gamekeeper's thumb of left hand   . GERD (gastroesophageal reflux disease)   . H/O cardiac radiofrequency ablation   . Headache   . History of atrial fibrillation   . Polymyalgia rheumatica ([place])   . Sleep apnea    mild OSA wears mouth guards    Past Surgical History:  Procedure Laterality Date  . BILATERAL CARPAL TUNNEL RELEASE    . CARDIAC ELECTROPHYSIOLOGY STUDY AND ABLATION  2016   in [place]  . CHOLECYSTECTOMY    . COLONOSCOPY    . COLONOSCOPY    . COLONOSCOPY    . COLONOSCOPY N/A 03/18/2017   Procedure: COLONOSCOPY;  Surgeon: Rogene Houston, MD;  Location: [facility];  Service: Endoscopy;  Laterality: N/A;  930  . JOINT REPLACEMENT  2014   Right knee replacement  .  NASAL SINUS SURGERY    . NASAL SINUS SURGERY    . NECK SURGERY    . Right Carpal tunnel release    . Right knee arthroscopy    . Right knee arthroscopy    . Right knee fabella removal      Family History  Problem Relation Age of Onset  . Colon cancer Father   . Colon cancer Sister    Social History:  reports that she has never smoked. She has never used smokeless tobacco. She reports that she does not drink alcohol or use drugs.  Allergies:  Allergies  Allergen Reactions  . Augmentin [Amoxicillin-Pot Clavulanate] Swelling    Whole body  . Cantaloupe (Diagnostic) Other (See Comments)    Per allergy test   . Claritin [Loratadine] Itching  . Codeine Nausea And Vomiting  . Diltiazem Other (See Comments)    Really low BP  . Levaquin [Levofloxacin In D5w] Swelling  . Other     Tree nuts Per allergy test  . Penicillins Swelling    Has patient had a PCN reaction causing immediate rash, facial/tongue/throat swelling, SOB or lightheadedness with hypotension: unknown Has patient had a PCN reaction causing severe rash involving mucus membranes or skin necrosis:unknown Has patient had a PCN reaction occurring within the last 10 years: unknown  If all of the above answers are "NO", then may proceed with Cephalosporin use.   . Shellfish Allergy Other (See Comments)    Per allergy test  . Strawberry Flavor Other (See Comments)    Per allergy test  . Sulfa Antibiotics Other (See Comments)    Pt does not remember   . Azithromycin Palpitations  . Biaxin [Clarithromycin] Palpitations  . Gluten Meal Diarrhea and Rash  . Lasix [Furosemide] Rash  . Latex Rash  . Naprosyn [Naproxen] Rash    Medications Prior to Admission  Medication Sig Dispense Refill  . acetaminophen (TYLENOL 8 HOUR) 650 MG CR tablet Take 650 mg by mouth daily as needed for pain.    Marland Kitchen albuterol (PROVENTIL) (2.5 MG/3ML) 0.083% nebulizer solution Take 2.5 mg by nebulization daily as needed for wheezing or shortness of  breath.    Marland Kitchen aspirin EC 81 MG tablet Take 1 tablet (81 mg total) by mouth every other day.    . Calcium Carb-Cholecalciferol (CALCIUM 1000 + D PO) Take 1 tablet by mouth daily.    . cetirizine (ZYRTEC) 10 MG tablet Take 10 mg by mouth daily as needed for allergies.    . Mupirocin POWD 1 Dose by Does not apply route daily.    . predniSONE (DELTASONE) 10 MG tablet Take 10 mg by mouth daily with breakfast.    . sodium chloride (OCEAN) 0.65 % SOLN nasal spray Place 1 spray into both nostrils as needed for congestion.      No results found for this or any previous visit (from the past 48 hour(s)).  No results found.   Pertinent items are noted in HPI.  Height 5\' 5"  (1.651 m), weight 70.3 kg (155 lb).  General appearance: alert, cooperative and appears stated age Head: Normocephalic, without obvious abnormality Neck: no JVD Resp: clear to auscultation bilaterally Cardio: regular rate and rhythm, S1, S2 normal, no murmur, click, rub or gallop GI: soft, non-tender; bowel sounds normal; no masses,  no organomegaly Extremities: pain left thumb MCP joint Pulses: 2+ and symmetric Skin: Skin color, texture, turgor normal. No rashes or lesions Neurologic: Grossly normal Incision/Wound: na  Assessment/Plan Assessment:  1. Gamekeeper's thumb of left hand   Plan: Discussed repair reconstruction with her. Pre-peri-and postoperative course are discussed along with risks and complications. She is aware there is no guarantee to the surgery the possibility of infection recurrence injury to arteries nerves tendons incomplete relief symptoms dystrophy. She is advised that if there is any arthritic changes tightening the joint may cause this to increase its discomfort requiring a fusion. She would like to proceed. She is scheduled for repair reconstruction ulnar collateral ligament using APL if necessary. For her left thumb this will be performed as an outpatient under regional anesthesia. She is advised  we are going for stability to the thumb rather than to maintain all mobility. Shins are encouraged and answered to her satisfaction.      Caitlin Barnes R 09/03/2017, 11:14 AM

## 2017-09-04 ENCOUNTER — Encounter (HOSPITAL_BASED_OUTPATIENT_CLINIC_OR_DEPARTMENT_OTHER): Payer: Self-pay | Admitting: Orthopedic Surgery

## 2017-09-04 NOTE — Op Note (Signed)
NAME:  Caitlin Barnes, Caitlin Barnes NO.:  [id number]  MEDICAL RECORD NO.:  [id number]  LOCATION:                                 FACILITY:  PHYSICIAN:  Daryll Brod, M.D.            DATE OF BIRTH:  DATE OF PROCEDURE:  09/03/2017 DATE OF DISCHARGE:                              OPERATIVE REPORT   PREOPERATIVE DIAGNOSIS:  Torn ulnar collateral ligament metacarpophalangeal joint, left thumb.  POSTOPERATIVE DIAGNOSIS:  Torn ulnar collateral ligament metacarpophalangeal joint, left thumb.  OPERATION:  Repair ruptured ulnar collateral ligament metacarpophalangeal joint, left thumb.  SURGEON:  Daryll Brod, MD.  ASSISTANT:  Leanora Cover, MD.  ANESTHESIA:  Supraclavicular block with IV sedation.  PLACE OF SURGERY:  [facility].  ANESTHESIOLOGIST:  Dr. Gifford Shave.  HISTORY:  The patient is a 62 year old female with a long history of injury to the metacarpophalangeal joint ulnar collateral ligament, which has not progressed following healing.  She is now approximately 3-1/2 months following surgery, continues to complain of pain with significant opening.  X-rays reveal rupture of the collateral ligament with healing, but incompetency.  She has marked opening to stress.  She has elected to undergo surgical repair, reconstruction as detailed by findings.  Pre, peri, and postoperative course have been discussed along with risks and complications.  She is aware there is no guarantee to the surgery; the possibility of infection; recurrence of injury to arteries, nerves, tendons; incomplete relief of symptoms; and dystrophy.  In the preoperative area, the patient is seen, the extremity marked by both patient and surgeon.  Antibiotic given.  She is aware that we are working for stability and not full mobility.  In preoperative area, the patient is given a supraclavicular block.  DESCRIPTION OF PROCEDURE:  She was brought to the operating room, where a general anesthetic, IV  sedation was carried out without difficulty. She was prepped using ChloraPrep in a supine position with the left arm free.  A 3-minute dry time was allowed.  Time-out taken, confirming the patient and procedure.  The limb was exsanguinated with an Esmarch bandage.  Tourniquet was placed high and the arm was inflated to 250 mmHg.  A curvilinear incision was made over the metacarpophalangeal joint apex on the ulnar side carried down through subcutaneous tissue. Bleeders were electrocauterized with bipolar.  Dorsal sensory branch of the ulnar nerve was identified.  The adductor aponeurosis was incised with sharp dissection.  The dorsal capsule opened the collateral ligament and healing was immediately apparent with a significant increase in scar tissue from the collateral ligament to the bone.  The scar was excised back to normal collateral ligament.  The area of insertion at the volar base of the proximal phalanx was roughened debrided with a rongeur.  A drill hole placed for a 1.1 mm JuggerKnot. This was inserted, firmly fixed in the bone and the tendon ligament was repaired using FiberWire.  This significantly stabilized the joint.  The dorsal capsule was then repaired with figure-of-eight 4-0 Mersilene sutures.  The wound irrigated.  The aponeurosis was then repaired with a running 5-0 Mersilene suture.  The skin was closed with interrupted 4-0  nylon sutures.  A sterile compressive dressing and dorsal palmar thumb spica splint applied.  On deflation of the tourniquet, all fingers immediately pinked.  She was taken to the recovery room for observation in satisfactory condition.  She will be discharged home to return to the [facility] in 1 week, on Percocet.    ______________________________ Daryll Brod, M.D.   ______________________________ Daryll Brod, M.D.    GK/MEDQ  D:  09/03/2017  T:  09/03/2017  Job:  [id number]

## 2019-12-02 IMAGING — MR MR [PERSON_NAME]*[PERSON_NAME]* W/CM
4 of 6 series · 12 of 40 positions shown · IV contrast (agent unspecified)
Comparison: None.

CLINICAL DATA: Injured thumb on [REDACTED]. Persistent pain and
limited range of motion.

EXAM:
MRI OF THE LEFT HAND WITH CONTRAST (MR Arthrogram)
TECHNIQUE: Multiplanar, multisequence MR imaging of the left thumb was
performed following intra-articular contrast injection under
fluoroscopic guidance. No intravenous contrast was administered.

[Series 12: T1 fat-sat · axial · left · 3.0mm · 0.12mm/px · z∈[-51,+35]mm · 3 of 38 slices shown]
[im 4/38]
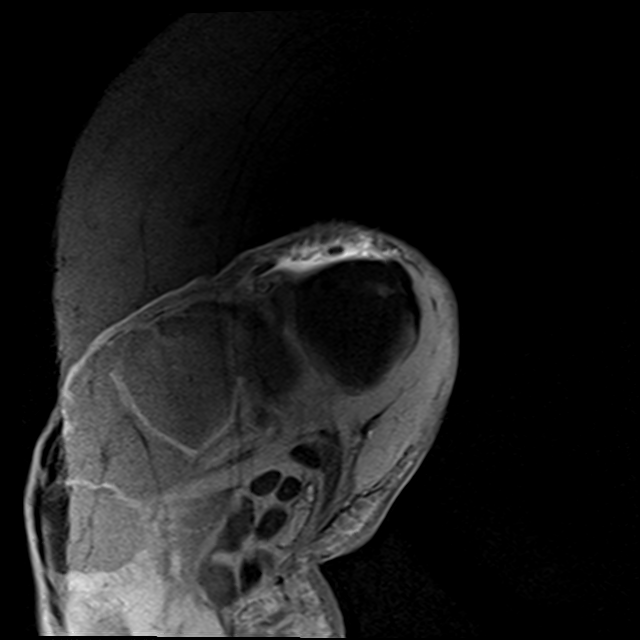
[im 19/38]
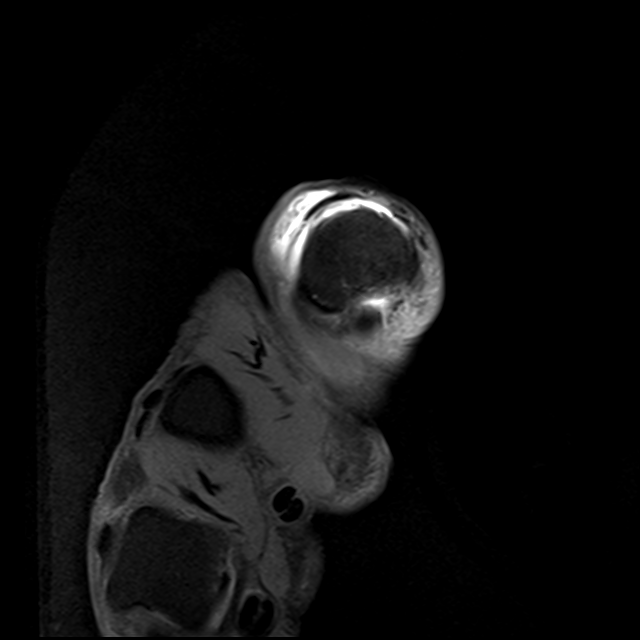
[im 34/38]
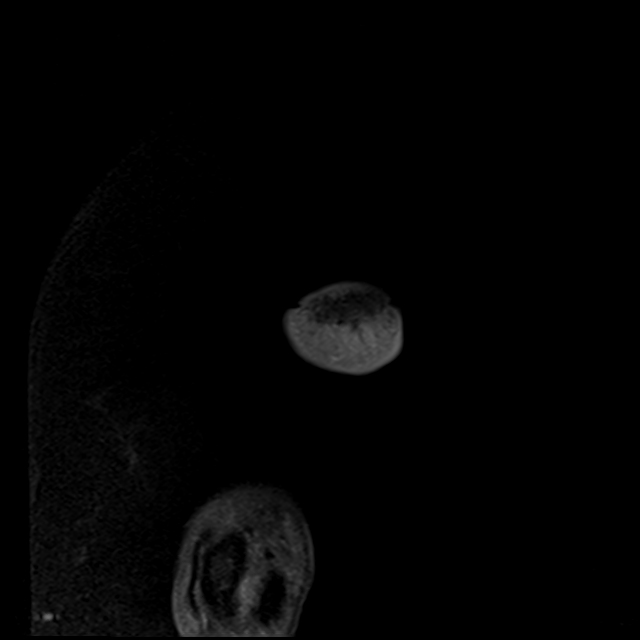

[Series 18: T2 fat-sat · coronal · left · 2.0mm · 0.16mm/px · 3 of 13 slices shown (1 of 2)]
[im 1/13]
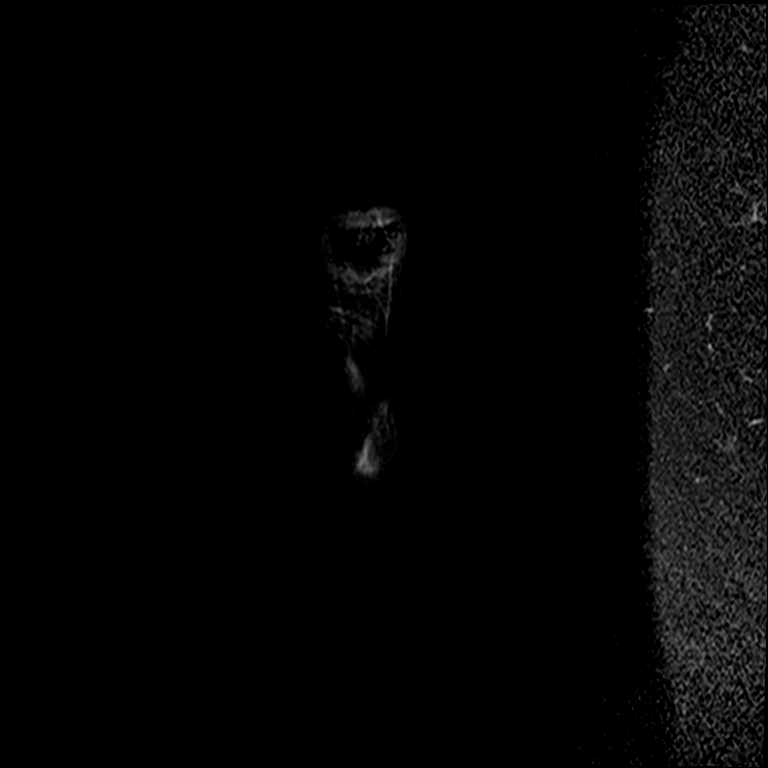
[im 9/13]
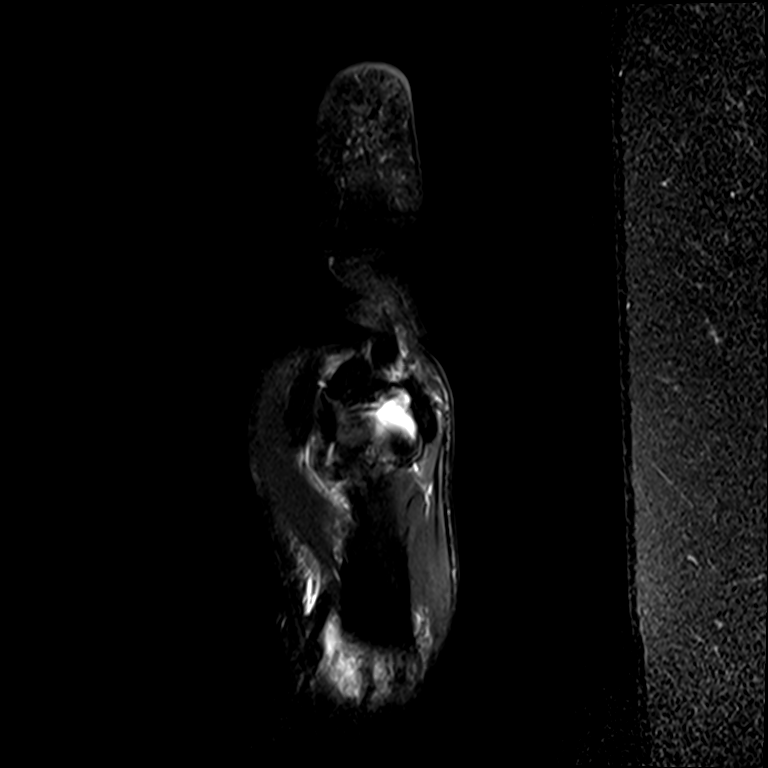
[im 13/13]
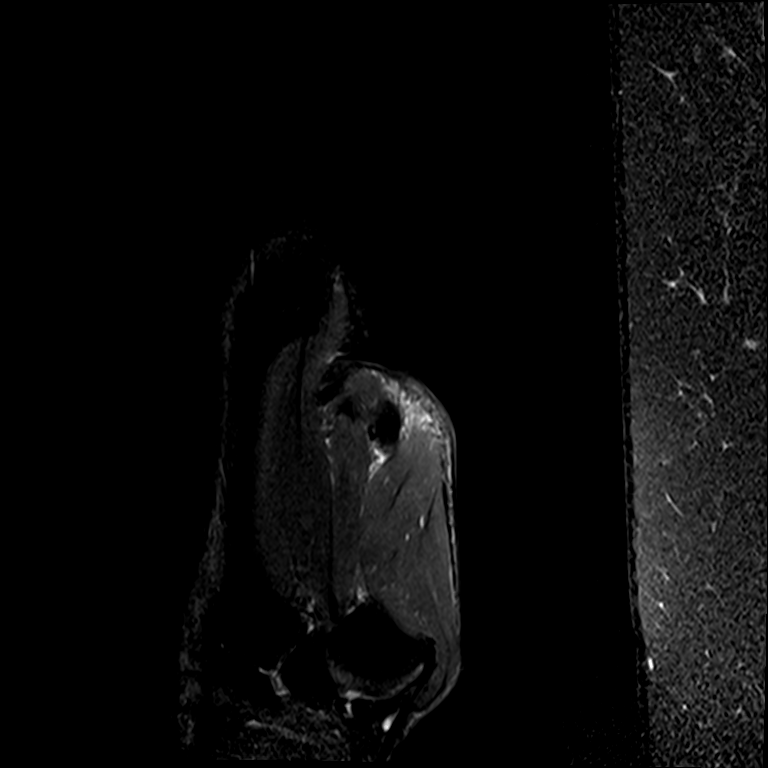

[Series 20: PD fat-sat · sagittal · left · 2.0mm · 0.14mm/px · 3 of 19 slices shown]
[im 1/19]
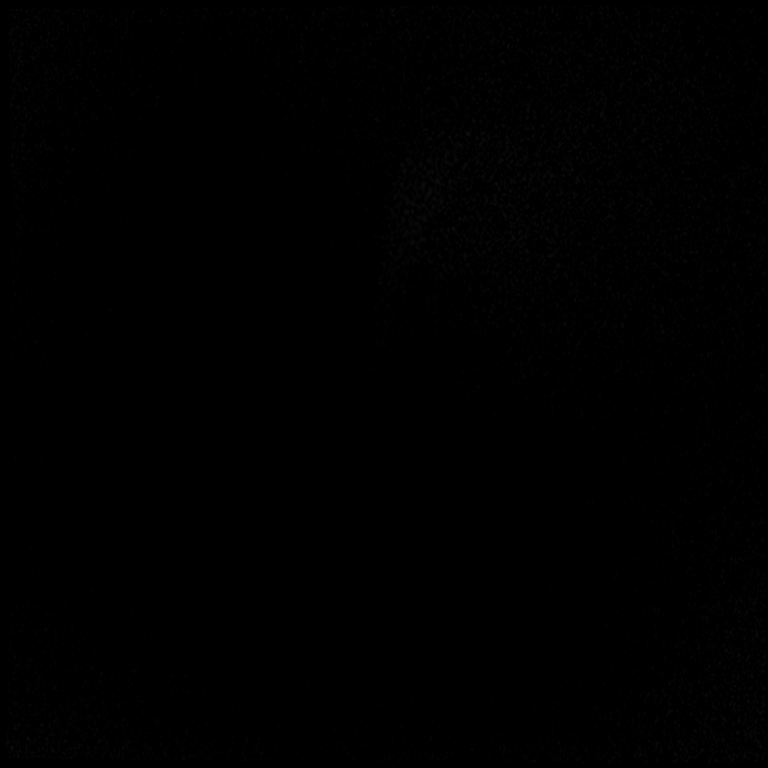
[im 10/19]
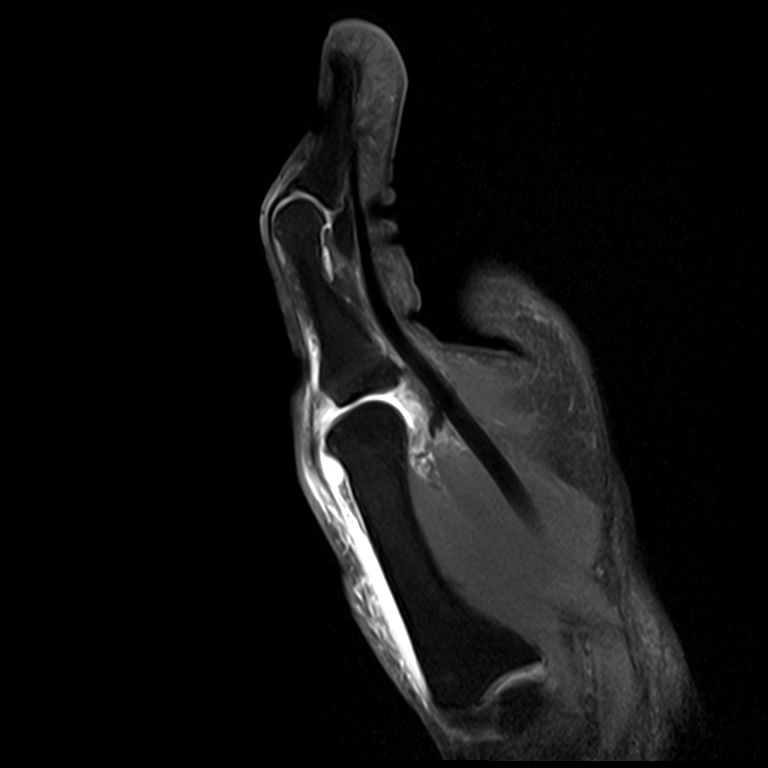
[im 19/19]
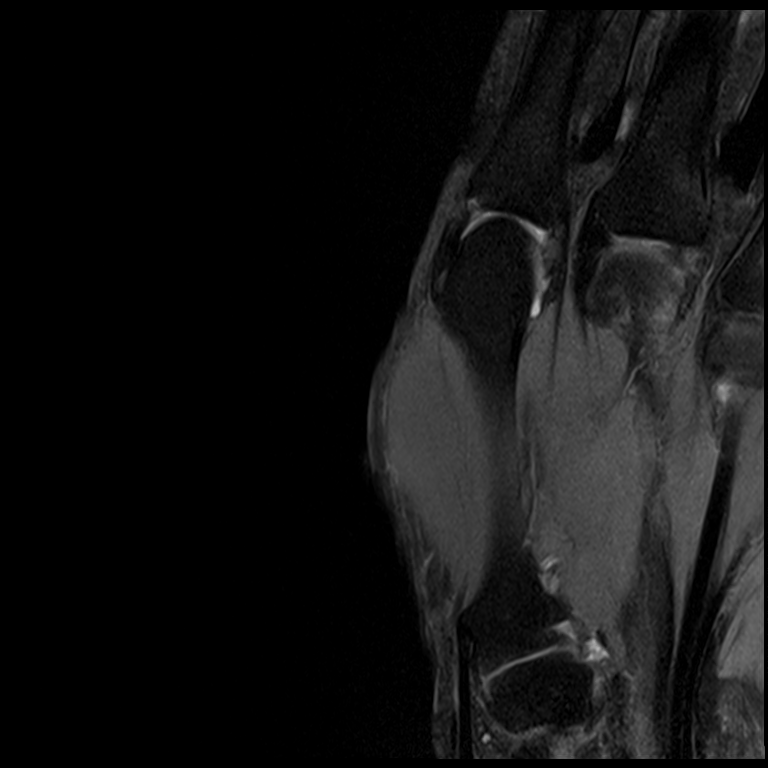

[Series 21: T2 fat-sat · axial · left · 3.0mm · 0.31mm/px · z∈[-52,+28]mm · 3 of 38 slices shown (2 of 2)]
[im 5/38]
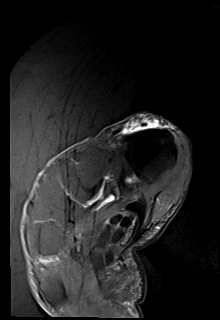
[im 21/38]
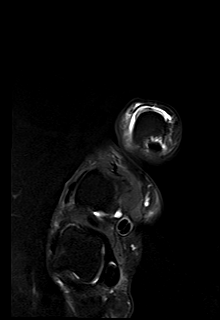
[im 33/38]
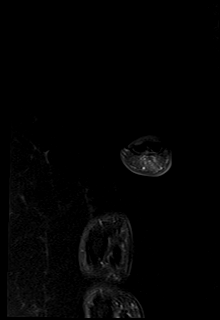

[12 of 40 positions shown; findings below may reference images not displayed]

FINDINGS: The ulnar collateral ligament is intact. It is somewhat wavy in
appearance and demonstrates areas of increased T2 signal intensity.
I suspect it was torn but has now healed/fibrosed. No extravasation
of contrast through the ligament. There is extravasation through the
dorsal capsule. The radial collateral ligament is intact. The
adductor pollicis aponeurosis appears normal.

Minimal degenerative changes at the MCP joint. No bone contusion or
osteochondral lesion. The flexor and extensor tendons are intact.

The other bony structures appear normal and the visualized hand
musculature appears normal.

The pulley structures of the thumb appear normal.
IMPRESSION: Suspect remote ulnar collateral ligament injury with healing and
fibrosis. No full-thickness tear. The adductor aponeurosis appears
normal.

No significant bony injury. Minimal degenerative changes at the MCP
joint.

## 2022-02-17 ENCOUNTER — Encounter (INDEPENDENT_AMBULATORY_CARE_PROVIDER_SITE_OTHER): Payer: Self-pay

## 2022-03-03 ENCOUNTER — Encounter (INDEPENDENT_AMBULATORY_CARE_PROVIDER_SITE_OTHER): Payer: Self-pay | Admitting: *Deleted

## 2022-04-02 ENCOUNTER — Telehealth (INDEPENDENT_AMBULATORY_CARE_PROVIDER_SITE_OTHER): Payer: Self-pay

## 2022-04-02 ENCOUNTER — Encounter (INDEPENDENT_AMBULATORY_CARE_PROVIDER_SITE_OTHER): Payer: Self-pay

## 2022-04-02 ENCOUNTER — Other Ambulatory Visit (INDEPENDENT_AMBULATORY_CARE_PROVIDER_SITE_OTHER): Payer: Self-pay

## 2022-04-02 DIAGNOSIS — Z86010 Personal history of colonic polyps: Secondary | ICD-10-CM

## 2022-04-02 DIAGNOSIS — Z800 Family history of malignant neoplasm of digestive organs: Secondary | ICD-10-CM

## 2022-04-02 NOTE — Telephone Encounter (Signed)
Referring MD/PCP: Dr Ralph Leyden   Procedure: Tcs  Reason/Indication:  hx of colon polyps fam hx of colon cancer  Has patient had this procedure before?  Yes   If so, when, by whom and where? 5 yrs ago   Is there a family history of colon cancer?  Yes   Who?  What age when diagnosed? Father/Sister   Is patient diabetic? If yes, Type 1 or Type 2   No       Does patient have prosthetic heart valve or mechanical valve?  No   Do you have a pacemaker/defibrillator?  No   Has patient ever had endocarditis/atrial fibrillation? Yes   Does patient use oxygen? No   Has patient had joint replacement within last 12 months?  No  Is patient constipated or do they take laxatives? No   Does patient have a history of alcohol/drug use?  No   Have you had a stroke/heart attack last 6 mths? No   Do you take medicine for weight loss?  No   For female patients,: have you had a hysterectomy no                      are you post menopausal yes                       do you still have your menstrual cycle no   Is patient on blood thinner such as Coumadin, Plavix and/or Aspirin? Yes   Medications: asa 81 mg daily, prednisone 5 mg tab daily, zyrtec 5 mg prn  Allergies: Latex, PCN, Caritin , Codeine, Sulfur Drugs, Diltiazem, Levaquin, biaxin, lasix, naprosyn, tequin, zithromax, gluten   Medication Adjustment per Dr Jenetta Downer none  Procedure date & time: 05/16/22 at 12:45

## 2022-04-10 ENCOUNTER — Telehealth (INDEPENDENT_AMBULATORY_CARE_PROVIDER_SITE_OTHER): Payer: Self-pay

## 2022-04-10 MED ORDER — PEG 3350-KCL-NA BICARB-NACL 420 G PO SOLR: 4000.0000 mL | ORAL | 0 refills | Status: DC

## 2022-04-10 NOTE — Telephone Encounter (Signed)
Caitlin Barnes Caitlin Barnes, CMA  ?

## 2022-05-16 ENCOUNTER — Ambulatory Visit (HOSPITAL_COMMUNITY)
Admission: RE | Admit: 2022-05-16 | Discharge: 2022-05-16 | Disposition: A | Discharge status: Home / Self Care | Payer: Medicare Other | Attending: Gastroenterology | Admitting: Gastroenterology

## 2022-05-16 ENCOUNTER — Encounter (HOSPITAL_COMMUNITY): Payer: Self-pay | Admitting: Gastroenterology

## 2022-05-16 ENCOUNTER — Ambulatory Visit (HOSPITAL_COMMUNITY): Payer: Medicare Other | Admitting: Anesthesiology

## 2022-05-16 ENCOUNTER — Ambulatory Visit (HOSPITAL_BASED_OUTPATIENT_CLINIC_OR_DEPARTMENT_OTHER): Payer: Medicare Other | Admitting: Anesthesiology

## 2022-05-16 ENCOUNTER — Other Ambulatory Visit: Payer: Self-pay

## 2022-05-16 ENCOUNTER — Encounter (HOSPITAL_COMMUNITY)
Admission: RE | Disposition: A | Discharge status: Home / Self Care | Payer: Self-pay | Source: Home / Self Care | Attending: Gastroenterology

## 2022-05-16 DIAGNOSIS — G4730 Sleep apnea, unspecified: Secondary | ICD-10-CM | POA: Insufficient documentation

## 2022-05-16 DIAGNOSIS — D122 Benign neoplasm of ascending colon: Secondary | ICD-10-CM

## 2022-05-16 DIAGNOSIS — K648 Other hemorrhoids: Secondary | ICD-10-CM | POA: Diagnosis not present

## 2022-05-16 DIAGNOSIS — Z800 Family history of malignant neoplasm of digestive organs: Secondary | ICD-10-CM | POA: Insufficient documentation

## 2022-05-16 DIAGNOSIS — Z86010 Personal history of colonic polyps: Secondary | ICD-10-CM

## 2022-05-16 DIAGNOSIS — D128 Benign neoplasm of rectum: Secondary | ICD-10-CM | POA: Diagnosis not present

## 2022-05-16 DIAGNOSIS — I4891 Unspecified atrial fibrillation: Secondary | ICD-10-CM | POA: Insufficient documentation

## 2022-05-16 DIAGNOSIS — J45909 Unspecified asthma, uncomplicated: Secondary | ICD-10-CM | POA: Insufficient documentation

## 2022-05-16 DIAGNOSIS — D125 Benign neoplasm of sigmoid colon: Secondary | ICD-10-CM | POA: Insufficient documentation

## 2022-05-16 DIAGNOSIS — K219 Gastro-esophageal reflux disease without esophagitis: Secondary | ICD-10-CM | POA: Insufficient documentation

## 2022-05-16 DIAGNOSIS — Z1211 Encounter for screening for malignant neoplasm of colon: Secondary | ICD-10-CM | POA: Insufficient documentation

## 2022-05-16 HISTORY — PX: POLYPECTOMY: SHX5525

## 2022-05-16 HISTORY — PX: COLONOSCOPY WITH PROPOFOL: SHX5780

## 2022-05-16 LAB — HM COLONOSCOPY

## 2022-05-16 SURGERY — COLONOSCOPY WITH PROPOFOL
Anesthesia: General

## 2022-05-16 MED ORDER — LIDOCAINE HCL (CARDIAC) PF 100 MG/5ML IV SOSY
PREFILLED_SYRINGE | INTRAVENOUS | Status: DC | PRN
  Administered 2022-05-16: 60 mg via INTRATRACHEAL

## 2022-05-16 MED ORDER — STERILE WATER FOR IRRIGATION IR SOLN
Status: DC | PRN
  Administered 2022-05-16: 240 mL

## 2022-05-16 MED ORDER — PROPOFOL 500 MG/50ML IV EMUL
INTRAVENOUS | Status: DC | PRN
  Administered 2022-05-16: 200 ug/kg/min via INTRAVENOUS

## 2022-05-16 MED ORDER — LACTATED RINGERS IV SOLN: INTRAVENOUS | Status: DC

## 2022-05-16 MED ORDER — PROPOFOL 10 MG/ML IV BOLUS
INTRAVENOUS | Status: DC | PRN
  Administered 2022-05-16: 70 mg via INTRAVENOUS

## 2022-05-16 NOTE — Anesthesia Preprocedure Evaluation (Addendum)
Anesthesia Evaluation  Patient identified by MRN, date of birth, ID band Patient awake    Reviewed: Allergy & Precautions, H&P , NPO status , Patient's Chart, lab work & pertinent test results  History of Anesthesia Complications (+) PROLONGED EMERGENCE and history of anesthetic complications  Airway Mallampati: II  TM Distance: >3 FB Neck ROM: Full   Comment: NECK SX Dental  (+) Dental Advisory Given, Teeth Intact   Pulmonary asthma , sleep apnea    Pulmonary exam normal breath sounds clear to auscultation       Cardiovascular Normal cardiovascular exam+ dysrhythmias Atrial Fibrillation  Rhythm:Regular Rate:Normal     Neuro/Psych  Headaches PSYCHIATRIC DISORDERS Anxiety      negative psych ROS   GI/Hepatic Neg liver ROS,GERD  Controlled,,  Endo/Other  negative endocrine ROS    Renal/GU negative Renal ROS  negative genitourinary   Musculoskeletal negative musculoskeletal ROS (+) Arthritis , Osteoarthritis,    Abdominal   Peds negative pediatric ROS (+)  Hematology negative hematology ROS (+)   Anesthesia Other Findings   Reproductive/Obstetrics negative OB ROS                             Anesthesia Physical Anesthesia Plan  ASA: 2  Anesthesia Plan: General   Post-op Pain Management: Minimal or no pain anticipated   Induction: Intravenous  PONV Risk Score and Plan: Propofol infusion  Airway Management Planned: Nasal Cannula and Natural Airway  Additional Equipment:   Intra-op Plan:   Post-operative Plan:   Informed Consent: I have reviewed the patients History and Physical, chart, labs and discussed the procedure including the risks, benefits and alternatives for the proposed anesthesia with the patient or authorized representative who has indicated his/her understanding and acceptance.     Dental advisory given  Plan Discussed with: CRNA and Surgeon  Anesthesia Plan  Comments:        Anesthesia Quick Evaluation

## 2022-05-16 NOTE — Op Note (Signed)
[facility] Patient Name: Caitlin Barnes Procedure Date: 05/16/2022 1:17 PM MRN: [id number] Date of Birth: 1955/10/06 Attending MD: Maylon Peppers , , [id number] CSN: [id number] Age: 66 Admit Type: Outpatient Procedure:                Colonoscopy Indications:              Surveillance: Personal history of adenomatous                            polyps on last colonoscopy 5 years ago, Family                            history of colon cancer in multiple first-degree                            relatives Providers:                Maylon Peppers, Lambert Mody, Randa Spike, Technician Referring MD:              Medicines:                Monitored Anesthesia Care Complications:            No immediate complications. Estimated Blood Loss:     Estimated blood loss: none. Procedure:                Pre-Anesthesia Assessment:                           - Prior to the procedure, a History and Physical                            was performed, and patient medications, allergies                            and sensitivities were reviewed. The patient's                            tolerance of previous anesthesia was reviewed.                           - The risks and benefits of the procedure and the                            sedation options and risks were discussed with the                            patient. All questions were answered and informed                            consent was obtained.                           After obtaining informed consent, the colonoscope  was passed under direct vision. Throughout the                            procedure, the patient's blood pressure, pulse, and                            oxygen saturations were monitored continuously. The                            [id number]-HQ190L ([id number]) scope was introduced through                            the anus and advanced to the the cecum, identified                             by appendiceal orifice and ileocecal valve. The                            colonoscopy was performed without difficulty. The                            patient tolerated the procedure well. The quality                            of the bowel preparation was excellent. Scope In: 1:33:11 PM Scope Out: 1:59:50 PM Scope Withdrawal Time: 0 hours 22 minutes 3 seconds  Total Procedure Duration: 0 hours 26 minutes 39 seconds  Findings:      The perianal and digital rectal examinations were normal.      Two sessile polyps were found in the ascending colon. The polyps were 2       to 5 mm in size. These polyps were removed with a cold snare. Resection       and retrieval were complete.      Two sessile polyps were found in the rectum and sigmoid colon. The       polyps were 4 to 6 mm in size. These polyps were removed with a cold       snare. Resection and retrieval were complete.      A patchy area of nodular mucosa was found in the distal rectum, did not       have clear endoscopic adenomatous changes. Biopsies were taken with a       cold forceps for histology.      Non-bleeding internal hemorrhoids were found during retroflexion. The       hemorrhoids were small. Impression:               - Two 2 to 5 mm polyps in the ascending colon,                            removed with a cold snare. Resected and retrieved.                           - Two 4 to 6 mm polyps in the rectum and in the  sigmoid colon, removed with a cold snare. Resected                            and retrieved.                           - Nodular mucosa in the distal rectum. Biopsied.                           - Non-bleeding internal hemorrhoids. Moderate Sedation:      Per Anesthesia Care Recommendation:           - Discharge patient to home (ambulatory).                           - Resume previous diet.                           - Await pathology results.                            - Repeat colonoscopy for surveillance based on                            pathology results. Procedure Code(s):        --- Professional ---                           [phone], Colonoscopy, flexible; with removal of                            tumor(s), polyp(s), or other lesion(s) by snare                            technique                           [id number], 63, Colonoscopy, flexible; with biopsy,                            single or multiple Diagnosis Code(s):        --- Professional ---                           Z86.010, Personal history of colonic polyps                           D12.2, Benign neoplasm of ascending colon                           D12.8, Benign neoplasm of rectum                           D12.5, Benign neoplasm of sigmoid colon                           K62.89, Other specified diseases of anus and rectum  K64.8, Other hemorrhoids                           Z80.0, Family history of malignant neoplasm of                            digestive organs CPT copyright 2022 [employer]. All rights reserved. The codes documented in this report are preliminary and upon coder review may  be revised to meet current compliance requirements. Maylon Peppers, MD Maylon Peppers,  05/16/2022 2:13:58 PM This report has been signed electronically. Number of Addenda: 0

## 2022-05-16 NOTE — H&P (Signed)
Caitlin Barnes is an 66 y.o. female.   Chief Complaint: history colon polyps and family history colon cancer. HPI: 66 y/o F with PMH anxiety, GERD, coming for evaluation of history colon polyps and family history colon cancer.  The patient denies having any nausea, vomiting, fever, chills, hematochezia, melena, hematemesis, abdominal distention, abdominal pain, diarrhea, jaundice, pruritus or weight loss.  Last colonoscopy 2018 - One 5 mm polyp in the descending colon, removed with a cold snare. Resected and retrieved.  Pathology tubular adenoma  Patient reports her father was diagnosed with colon cancer in his 69s and sister was diagnosed in her 6s.  Past Medical History:  Diagnosis Date   Anxiety    Arthritis    Asthma due to seasonal allergies    Complication of anesthesia    hard time waking up   Dysrhythmia 2016   ablation for a-fib in [place], no meds now   Gamekeeper's thumb of left hand    GERD (gastroesophageal reflux disease)    H/O cardiac radiofrequency ablation    Headache    History of atrial fibrillation    Polymyalgia rheumatica (HCC)    Sleep apnea    mild OSA wears mouth guards    Past Surgical History:  Procedure Laterality Date   BILATERAL CARPAL TUNNEL RELEASE     CARDIAC ELECTROPHYSIOLOGY STUDY AND ABLATION  2016   in [place]     COLONOSCOPY N/A 03/18/2017   Procedure: COLONOSCOPY;  Surgeon: Rogene Houston, MD;  [facility];  Service: Endoscopy;  Laterality: N/A;  930   JOINT REPLACEMENT  2014   Right knee replacement   NASAL SINUS SURGERY     NASAL SINUS SURGERY     NECK SURGERY     Right Carpal tunnel release     Right knee arthroscopy     Right knee arthroscopy     Right knee fabella removal     ULNAR COLLATERAL LIGAMENT REPAIR Left 09/03/2017   Procedure: REPAIR/RECONSTRUCTION ULNA COLLATERAL LIGAMENT METACARPAL LEFT THUMB;  Surgeon: Daryll Brod, MD;   Location: [facility];  Service: Orthopedics;  Laterality: Left;  AXILLARY BLOCK    Family History  Problem Relation Age of Onset   Colon cancer Father    Colon cancer Sister    Social History:  reports that she has never smoked. She has never used smokeless tobacco. She reports that she does not drink alcohol and does not use drugs.  Allergies:  Allergies  Allergen Reactions   Cefdinir Anaphylaxis   Montelukast Other (See Comments)    Other Reaction: GI Upset, Other reaction   Augmentin [Amoxicillin-Pot Clavulanate] Swelling    Whole body   Cantaloupe (Diagnostic) Other (See Comments)    Per allergy test    Claritin [Loratadine] Itching   Codeine Nausea And Vomiting   Diltiazem Other (See Comments)    Really low BP   Iodinated Contrast Media Other (See Comments)    Unknown reaction    Levaquin [Levofloxacin In D5w] Swelling   Other     Tree nuts Per allergy test   Penicillins Swelling    Has patient had a PCN reaction causing immediate rash, facial/tongue/throat swelling, SOB or lightheadedness with hypotension: unknown Has patient had a PCN reaction causing severe rash involving mucus membranes or skin necrosis:unknown Has patient had a PCN reaction occurring within the last 10 years: unknown If all  of the above answers are "NO", then may proceed with Cephalosporin use.    Pineapple Other (See Comments)    Mouth blisters (with large portions)   Shellfish Allergy Other (See Comments)    Per allergy test   Strawberry Flavor Other (See Comments)    Per allergy test   Sulfa Antibiotics Other (See Comments)    Pt does not remember    Azithromycin Palpitations   Biaxin [Clarithromycin] Palpitations   Gluten Meal Diarrhea and Rash   Lasix [Furosemide] Rash   Latex Rash   Naprosyn [Naproxen] Rash    Medications Prior to Admission  Medication Sig Dispense Refill   acetaminophen (TYLENOL 8 HOUR) 650 MG CR tablet Take 650 mg by mouth daily as needed for  pain.     aspirin EC 81 MG tablet Take 81 mg by mouth 3 (three) times a week.     Calcium-Vitamin D-Vitamin K (VIACTIV CALCIUM PLUS D) 650-12.5-40 MG-MCG-MCG CHEW Chew 1 tablet by mouth in the morning.     Cannabidiol POWD Apply 1 Application topically as needed (pain.). CBD Pain Rub Cream     cetirizine (ZYRTEC) 10 MG tablet Take 5 mg by mouth daily as needed for allergies.     Cholecalciferol (VITAMIN D3 GUMMIES PO) Take 2,000 Units by mouth daily with lunch.     Menthol, Topical Analgesic, (BIOFREEZE) 5 % PTCH Apply topically.     mupirocin ointment (BACTROBAN) 2 % Apply 1 Application topically 2 (two) times daily as needed (nasal sores).     oxymetazoline (AFRIN) 0.05 % nasal spray Place 1 spray into both nostrils 2 (two) times daily.     polyethylene glycol-electrolytes (TRILYTE) 420 g solution Take 4,000 mLs by mouth as directed. 4000 mL 0   predniSONE (DELTASONE) 5 MG tablet Take 10 mg by mouth in the morning.     albuterol (PROVENTIL) (2.5 MG/3ML) 0.083% nebulizer solution Take 2.5 mg by nebulization daily as needed for wheezing or shortness of breath.     augmented betamethasone dipropionate (DIPROLENE-AF) 0.05 % ointment Apply 1 Application topically 2 (two) times daily as needed (skin irritation/rash).     budesonide (PULMICORT) 0.5 MG/2ML nebulizer solution 0.5 mg at bedtime. Via sinus rinse     EPINEPHrine (EPIPEN 2-PAK) 0.3 mg/0.3 mL IJ SOAJ injection Inject 0.3 mg into the muscle as needed for anaphylaxis.      No results found for this or any previous visit (from the past 48 hour(s)). No results found.  Review of Systems  All other systems reviewed and are negative.   Blood pressure (!) 157/85, pulse 99, temperature 97.7 F (36.5 C), temperature source Oral, resp. rate (!) 23, height '5\' 5"'$  (1.651 m), weight 63.5 kg, SpO2 98 %. Physical Exam  GENERAL: The patient is AO x3, in no acute distress. HEENT: Head is normocephalic and atraumatic. EOMI are intact. Mouth is well  hydrated and without lesions. NECK: Supple. No masses LUNGS: Clear to auscultation. No presence of rhonchi/wheezing/rales. Adequate chest expansion HEART: RRR, normal s1 and s2. ABDOMEN: Soft, nontender, no guarding, no peritoneal signs, and nondistended. BS +. No masses. EXTREMITIES: Without any cyanosis, clubbing, rash, lesions or edema. NEUROLOGIC: AOx3, no focal motor deficit. SKIN: no jaundice, no rashes  Assessment/Plan 66 y/o F with PMH anxiety, GERD, coming for evaluation of history colon polyps and family history colon cancer.  Will proceed with colonoscopy.  Harvel Quale, MD 05/16/2022, 1:01 PM

## 2022-05-16 NOTE — Anesthesia Postprocedure Evaluation (Signed)
Anesthesia Post Note  Patient: Devaeh Amadi  Procedure(s) Performed: COLONOSCOPY WITH PROPOFOL POLYPECTOMY  Patient location during evaluation: Phase II Anesthesia Type: General Level of consciousness: awake and alert and oriented Pain management: pain level controlled Vital Signs Assessment: post-procedure vital signs reviewed and stable Respiratory status: spontaneous breathing, nonlabored ventilation and respiratory function stable Cardiovascular status: blood pressure returned to baseline and stable Postop Assessment: no apparent nausea or vomiting Anesthetic complications: no  No notable events documented.   Last Vitals:  Vitals:   05/16/22 1118 05/16/22 1404  BP: (!) 157/85 (!) 99/57  Pulse: 99 94  Resp: (!) 23 19  Temp: 36.5 C 36.6 C  SpO2: 98% 100%    Last Pain:  Vitals:   05/16/22 1404  TempSrc: Oral  PainSc: 0-No pain                 Teralyn Mullins C Meldon Hanzlik

## 2022-05-16 NOTE — Transfer of Care (Signed)
Immediate Anesthesia Transfer of Care Note  Patient: Caitlin Barnes  Procedure(s) Performed: COLONOSCOPY WITH PROPOFOL POLYPECTOMY  Patient Location: Endoscopy Unit  Anesthesia Type:General  Level of Consciousness: awake, alert , oriented, and patient cooperative  Airway & Oxygen Therapy: Patient Spontanous Breathing  Post-op Assessment: Report given to RN, Post -op Vital signs reviewed and stable, and Patient moving all extremities  Post vital signs: Reviewed and stable  Last Vitals:  Vitals Value Taken Time  BP 99/57 05/16/22 1404  Temp 36.6 C 05/16/22 1404  Pulse 94 05/16/22 1404  Resp 19 05/16/22 1404  SpO2 100 % 05/16/22 1404    Last Pain:  Vitals:   05/16/22 1404  TempSrc: Oral  PainSc: 0-No pain         Complications: No notable events documented.

## 2022-05-19 ENCOUNTER — Encounter (INDEPENDENT_AMBULATORY_CARE_PROVIDER_SITE_OTHER): Payer: Self-pay | Admitting: *Deleted

## 2022-05-20 LAB — SURGICAL PATHOLOGY

## 2022-05-23 ENCOUNTER — Encounter (HOSPITAL_COMMUNITY): Payer: Self-pay | Admitting: Gastroenterology

## 2024-01-11 ENCOUNTER — Ambulatory Visit: Attending: Internal Medicine | Admitting: Internal Medicine

## 2024-01-11 ENCOUNTER — Encounter: Payer: Self-pay | Admitting: Internal Medicine

## 2024-01-11 VITALS — BP 174/86 | HR 65 | Ht 65.0 in | Wt 152.6 lb

## 2024-01-11 DIAGNOSIS — I4819 Other persistent atrial fibrillation: Secondary | ICD-10-CM | POA: Diagnosis present

## 2024-01-11 DIAGNOSIS — I5032 Chronic diastolic (congestive) heart failure: Secondary | ICD-10-CM | POA: Insufficient documentation

## 2024-01-11 DIAGNOSIS — Z136 Encounter for screening for cardiovascular disorders: Secondary | ICD-10-CM | POA: Diagnosis present

## 2024-01-11 MED ORDER — DAPAGLIFLOZIN PROPANEDIOL 10 MG PO TABS: 10.0000 mg | ORAL_TABLET | Freq: Every day | ORAL | 5 refills | Status: AC

## 2024-01-11 NOTE — Progress Notes (Signed)
 Cardiology Office Note  Date: 01/11/2024   ID: Caitlin Barnes, DOB 10/01/1955, MRN [id number]  PCP:  Caitlin Laurance POUR, MD  Cardiologist:  Caitlin SHAUNNA Maywood, MD Electrophysiologist:  None   History of Present Illness: Caitlin Barnes is a 68 y.o. female  Here to establish care.  I reviewed all the records from her prior cardiologist.  Patient underwent A-fib ablation in 2017 in [place] after which she did not have any more A-fib.  She was also taken off systemic AC.  Currently metoprolol as needed basis however recently she was told to take it every day.  She does not tolerate metoprolol due to burning pain in her stomach.  She did not tolerate diltiazem in the past due to headaches.  Very sensitive to medications.  Does not have any symptoms of angina or DOE.  She has occasional palpitations.  EKG today showed NSR.  No dizziness, syncope, leg swelling.  Past Medical History:  Diagnosis Date   Anxiety    Arthritis    Asthma due to seasonal allergies    Complication of anesthesia    hard time waking up   Dysrhythmia 2016   ablation for a-fib in [place], no meds now   Gamekeeper's thumb of left hand    GERD (gastroesophageal reflux disease)    H/O cardiac radiofrequency ablation    Headache    History of atrial fibrillation    Polymyalgia rheumatica (HCC)    Sleep apnea    mild OSA wears mouth guards    Past Surgical History:  Procedure Laterality Date   BILATERAL CARPAL TUNNEL RELEASE     CARDIAC ELECTROPHYSIOLOGY STUDY AND ABLATION  2016   in [place]   CHOLECYSTECTOMY     COLONOSCOPY     COLONOSCOPY     COLONOSCOPY     COLONOSCOPY N/A 03/18/2017   Procedure: COLONOSCOPY;  Surgeon: Caitlin Claudis PENNER, MD;  [facility];  Service: Endoscopy;  Laterality: N/A;  930   COLONOSCOPY WITH PROPOFOL  N/A 05/16/2022   Procedure: COLONOSCOPY WITH PROPOFOL ;  Surgeon: Caitlin Angelia Sieving, MD;  [facility];  Service: Gastroenterology;  Laterality:  N/A;  1245 ASA 2   JOINT REPLACEMENT  2014   Right knee replacement   NASAL SINUS SURGERY     NASAL SINUS SURGERY     NECK SURGERY     POLYPECTOMY  05/16/2022   Procedure: POLYPECTOMY;  Surgeon: Caitlin Angelia, Sieving, MD;  [facility];  Service: Gastroenterology;;   Right Carpal tunnel release     Right knee arthroscopy     Right knee arthroscopy     Right knee fabella removal     ULNAR COLLATERAL LIGAMENT REPAIR Left 09/03/2017   Procedure: REPAIR/RECONSTRUCTION ULNA COLLATERAL LIGAMENT METACARPAL LEFT THUMB;  Surgeon: Caitlin Kuba, MD;  [facility];  Service: Orthopedics;  Laterality: Left;  AXILLARY BLOCK    Current Outpatient Medications  Medication Sig Dispense Refill   acetaminophen  (TYLENOL  8 HOUR) 650 MG CR tablet Take 650 mg by mouth daily as needed for pain.     albuterol (PROVENTIL) (2.5 MG/3ML) 0.083% nebulizer solution Take 2.5 mg by nebulization daily as needed for wheezing or shortness of breath.     aspirin EC 81 MG tablet Take 81 mg by mouth 3 (three) times a week.     augmented betamethasone dipropionate (DIPROLENE-AF) 0.05 % ointment Apply 1 Application topically 2 (two) times daily as needed (skin irritation/rash).     budesonide (PULMICORT)  0.5 MG/2ML nebulizer solution 0.5 mg at bedtime. Via sinus rinse     Calcium-Vitamin D-Vitamin K (VIACTIV CALCIUM PLUS D) 650-12.5-40 MG-MCG-MCG CHEW Chew 1 tablet by mouth in the morning.     Cannabidiol POWD Apply 1 Application topically as needed (pain.). CBD Pain Rub Cream     cetirizine (ZYRTEC) 10 MG tablet Take 5 mg by mouth daily as needed for allergies.     Cholecalciferol (VITAMIN D3 GUMMIES PO) Take 2,000 Units by mouth daily with lunch.     diazepam (VALIUM) 2 MG tablet Take 2 mg by mouth as needed (meniers disease).     EPINEPHrine  (EPIPEN  2-PAK) 0.3 mg/0.3 mL IJ SOAJ injection Inject 0.3 mg into the muscle as needed for anaphylaxis.     MAGNESIUM GLYCINATE PO Take 240 mg by mouth  at bedtime.     Menthol, Topical Analgesic, (BIOFREEZE) 5 % PTCH Apply topically.     metoprolol succinate (TOPROL-XL) 25 MG 24 hr tablet Take 12.5 mg by mouth daily. (Patient taking differently: Take 12.5 mg by mouth daily. As needed - states makes her stomach burn to the point she feels like she may throw up - has also tried with food - no relief.)     mupirocin ointment (BACTROBAN) 2 % Apply 1 Application topically 2 (two) times daily as needed (nasal sores).     oxymetazoline (AFRIN) 0.05 % nasal spray Place 1 spray into both nostrils 2 (two) times daily.     predniSONE (DELTASONE) 5 MG tablet Take 10 mg by mouth in the morning.     tiZANidine (ZANAFLEX) 2 MG tablet Take 2 mg by mouth as needed.     triamterene-hydrochlorothiazide (MAXZIDE-25) 37.5-25 MG tablet Take 1 tablet by mouth daily.     No current facility-administered medications for this visit.   Allergies:  Cefdinir, Montelukast, Augmentin [amoxicillin-pot clavulanate], Cantaloupe (diagnostic), Claritin [loratadine], Codeine, Diltiazem, Iodinated contrast media, Levaquin [levofloxacin in d5w], Other, Penicillins, Pineapple, Shellfish allergy, Strawberry flavoring agent (non-screening), Sulfa antibiotics, Azithromycin, Biaxin [clarithromycin], Gluten meal, Lasix [furosemide], Latex, and Naprosyn [naproxen]   Social History: The patient  reports that she has never smoked. She has never used smokeless tobacco. She reports that she does not drink alcohol and does not use drugs.   Family History: The patient's family history includes Colon cancer in her father and sister.   ROS:  Please see the history of present illness. Otherwise, complete review of systems is positive for none  All other systems are reviewed and negative.   Physical Exam: VS:  BP (!) 174/86   Pulse 65   Ht 5' 5 (1.651 m)   Wt 152 lb 9.6 oz (69.2 kg)   SpO2 100%   BMI 25.39 kg/m , BMI Body mass index is 25.39 kg/m.  Wt Readings from Last 3 Encounters:   01/11/24 152 lb 9.6 oz (69.2 kg)  05/16/22 140 lb (63.5 kg)  09/03/17 153 lb (69.4 kg)    General: Patient appears comfortable at rest. HEENT: Conjunctiva and lids normal, oropharynx clear with moist mucosa. Neck: Supple, no elevated JVP or carotid bruits, no thyromegaly. Lungs: Clear to auscultation, nonlabored breathing at rest. Cardiac: Regular rate and rhythm, no S3 or significant systolic murmur, no pericardial rub. Abdomen: Soft, nontender, no hepatomegaly, bowel sounds present, no guarding or rebound. Extremities: No pitting edema, distal pulses 2+. Skin: Warm and dry. Musculoskeletal: No kyphosis. Neuropsychiatric: Alert and oriented x3, affect grossly appropriate.  Recent Labwork: No results found for requested labs within last 365  days.  No results found for: CHOL, TRIG, HDL, CHOLHDL, VLDL, LDLCALC, LDLDIRECT  Other Studies Reviewed Today: Reviewed records from her prior cardiologist, Dr. Arley Pinal in [place].   Assessment and Plan:  Persistent A-fib s/p ablation - EKG today showed NSR. - Takes metoprolol succinate 12.5 mg daily as needed for palpitations, this makes her stomach burn and nauseous.  Tried with food as well.  Switch to diltiazem 30 mg daily as needed for palpitations. - Not on systemic AC. - Echocardiogram from May 2024 in [place] showed normal LVEF, mild MR, mild TR, mild PI, trace AI and CVP 15 mmHg.  Chronic diastolic heart failure - She started to have DOE and leg swelling since COVID-19 infection in 2023, initially on Bumex and later switched to triamterene-HCTZ.  Working well with no symptoms of DOE and leg swelling now. - Continue triamterene hypertensive TC 37.5-25 mg once daily. - Start Farxiga  10 mg once daily.  Obtain BMP in 5 days.    I reviewed the records from her prior cardiologist, Dr. Arley Pinal in [place].  I spent 15 minutes in reviewing the records.  20 minutes spent with the patient and discussing her problems  and answered all her questions.  5 to 10 minutes spent in documentation.     Medication Adjustments/Labs and Tests Ordered: Current medicines are reviewed at length with the patient today.  Concerns regarding medicines are outlined above.    Disposition:  Follow up 6 months  Signed Garyson Stelly Priya Miara Emminger, MD, 01/11/2024 1:35 PM    [facility] [address] [place], [place], [place] [address]

## 2024-01-11 NOTE — Patient Instructions (Addendum)
 Medication Instructions:  Your physician has recommended you make the following change in your medication:  Stop taking Aspirin Take Metoprolol Succinate 12.5 mg as daily as needed for palpitations  Start taking Farxiga  10 mg once daily Continue taking all other medications as prescribed  Labwork: BMET in one week at [facility]  Testing/Procedures: None  Follow-Up: Your physician recommends that you schedule a follow-up appointment in: 6 months  Any Other Special Instructions Will Be Listed Below (If Applicable). Thank you for choosing  [facility]!     If you need a refill on your cardiac medications before your next appointment, please call your pharmacy.

## 2024-01-19 LAB — BASIC METABOLIC PANEL WITH GFR
BUN/Creatinine Ratio: 21 (ref 12–28)
BUN: 22 mg/dL (ref 8–27)
CO2: 24 mmol/L (ref 20–29)
Calcium: 9.4 mg/dL (ref 8.7–10.3)
Chloride: 98 mmol/L (ref 96–106)
Creatinine, Ser: 1.07 mg/dL — ABNORMAL HIGH (ref 0.57–1.00)
Glucose: 101 mg/dL — ABNORMAL HIGH (ref 70–99)
Potassium: 3.6 mmol/L (ref 3.5–5.2)
Sodium: 139 mmol/L (ref 134–144)
eGFR: 56 mL/min/1.73 — ABNORMAL LOW (ref >59)

## 2024-01-22 ENCOUNTER — Ambulatory Visit: Payer: Self-pay | Admitting: Internal Medicine

## 2024-01-23 ENCOUNTER — Encounter: Payer: Self-pay | Admitting: Internal Medicine

## 2024-01-23 DIAGNOSIS — G5603 Carpal tunnel syndrome, bilateral upper limbs: Secondary | ICD-10-CM

## 2024-01-26 ENCOUNTER — Telehealth: Payer: Self-pay

## 2024-01-26 DIAGNOSIS — G5603 Carpal tunnel syndrome, bilateral upper limbs: Secondary | ICD-10-CM

## 2024-01-26 NOTE — Telephone Encounter (Signed)
 Called patient to let her know that the current screening she had requested will not be done at [facility] that she will need to cone to [facility] in [place]. The order has been placed and will be called by scheduling to make the appointment.  See previous MyChart message encounter. Patient verbalized understanding.

## 2024-01-28 ENCOUNTER — Other Ambulatory Visit: Payer: Self-pay | Admitting: Internal Medicine

## 2024-01-28 DIAGNOSIS — G5603 Carpal tunnel syndrome, bilateral upper limbs: Secondary | ICD-10-CM

## 2024-02-01 ENCOUNTER — Encounter: Payer: Self-pay | Admitting: Internal Medicine

## 2024-02-01 ENCOUNTER — Ambulatory Visit (HOSPITAL_COMMUNITY)
Admission: RE | Admit: 2024-02-01 | Discharge: 2024-02-01 | Disposition: A | Discharge status: Home / Self Care | Source: Ambulatory Visit | Attending: Internal Medicine | Admitting: Internal Medicine

## 2024-02-01 DIAGNOSIS — G5603 Carpal tunnel syndrome, bilateral upper limbs: Secondary | ICD-10-CM | POA: Diagnosis not present

## 2024-02-01 MED ORDER — TECHNETIUM TC 99M PYROPHOSPHATE
21.6000 | Freq: Once | INTRAVENOUS | Status: AC
  Administered 2024-02-01: 21.6 via INTRAVENOUS

## 2024-02-02 LAB — MYOCARDIAL AMYLOID PLANAR & SPECT: H/CL Ratio: 1.19

## 2024-02-04 ENCOUNTER — Ambulatory Visit: Payer: Self-pay | Admitting: Internal Medicine

## 2024-02-09 NOTE — Addendum Note (Signed)
 Addended by: JOHNNYE LITTIE HERO on: 02/09/2024 10:27 AM   Modules accepted: Orders

## 2024-02-09 NOTE — Telephone Encounter (Signed)
 Spoke with advised her that Dr. Mallipeddi still would like her to have 24 hr urine completed to further rule out cardiac amyloidosis. Advised her that I will place them in the mail and she can go [facility] to have completed. Patient verbalized understanding.

## 2024-02-09 NOTE — Telephone Encounter (Signed)
 Patient notified of result.  Please refer to phone note from today for complete details.   Littie CHRISTELLA Croak, CMA 02/09/2024 10:32 AM

## 2024-02-09 NOTE — Telephone Encounter (Signed)
 Pt called in asking if she still needs to have any labs or urine sample done. Please advise.

## 2024-02-18 LAB — UPEP/UIFE/LIGHT CHAINS/TP, 24-HR UR

## 2024-02-20 LAB — MULTIPLE MYELOMA PANEL, SERUM
Albumin SerPl Elph-Mcnc: 3.6 g/dL (ref 2.9–4.4)
Albumin/Glob SerPl: 1.4 (ref 0.7–1.7)
Alpha 1: 0.2 g/dL (ref 0.0–0.4)
Alpha2 Glob SerPl Elph-Mcnc: 0.8 g/dL (ref 0.4–1.0)
B-Globulin SerPl Elph-Mcnc: 1 g/dL (ref 0.7–1.3)
Gamma Glob SerPl Elph-Mcnc: 0.6 g/dL (ref 0.4–1.8)
Globulin, Total: 2.7 g/dL (ref 2.2–3.9)
IgA/Immunoglobulin A, Serum: 88 mg/dL (ref 87–352)
IgG (Immunoglobin G), Serum: 745 mg/dL (ref 586–1602)
IgM (Immunoglobulin M), Srm: 92 mg/dL (ref 26–217)
Total Protein: 6.3 g/dL (ref 6.0–8.5)

## 2024-02-22 ENCOUNTER — Telehealth: Payer: Self-pay

## 2024-02-22 NOTE — Telephone Encounter (Signed)
 Received fax from [facility] requesting refill for 90 day supply for Farxiga  going forward as it becomes a maintenance medication.

## 2024-02-24 LAB — UPEP/UIFE/LIGHT CHAINS/TP, 24-HR UR
% BETA, Urine: 0 %
ALBUMIN, U: 100 %
ALPHA 1 URINE: 0 %
ALPHA-2-GLOBULIN, U: 0 %
Free Kappa Lt Chains,Ur: 4.9 mg/L (ref 1.17–86.46)
Free Lambda Lt Chains,Ur: 1.02 mg/L (ref 0.27–15.21)
GAMMA GLOBULIN URINE: 0 %
Kappa/Lambda Ratio,U: 4.8 (ref 1.83–14.26)
Protein, 24H Urine: <120 mg/(24.h) (ref 30–150)
Protein, Ur: <4 mg/dL

## 2024-03-01 ENCOUNTER — Other Ambulatory Visit (HOSPITAL_COMMUNITY): Payer: Self-pay

## 2024-03-07 ENCOUNTER — Encounter: Payer: Self-pay | Admitting: Internal Medicine

## 2024-03-09 ENCOUNTER — Other Ambulatory Visit: Payer: Self-pay

## 2024-03-09 DIAGNOSIS — D472 Monoclonal gammopathy: Secondary | ICD-10-CM

## 2024-03-23 ENCOUNTER — Encounter (INDEPENDENT_AMBULATORY_CARE_PROVIDER_SITE_OTHER): Payer: Self-pay | Admitting: Gastroenterology

## 2024-03-31 NOTE — Progress Notes (Signed)
 [facility]   [phone] [phone]   Clinic New Consult Note   Patient Care Team: Toy Laurance POUR, MD as PCP - General (Internal Medicine) Stacia Diannah SQUIBB, MD as PCP - Cardiology (Cardiology) 04/01/2024  CHIEF COMPLAINTS/PURPOSE OF CONSULTATION:  MGUS  REFERRING PHYSICIAN: Mallipeddi, Diannah SQUIBB, MD   Discussed the use of AI scribe software for clinical note transcription with the patient, who gave verbal consent to proceed.  History of Present Illness Caitlin Barnes is a 68 year old female who presents for evaluation of an abnormal protein in her blood. She was referred by Dr. Charity for evaluation of possible amyloidosis.  She has a history of 'stiff heart' and has undergone carpal tunnel surgery on both wrists. She experiences left-sided heart failure, kidney dysfunction, and issues with her central nervous and digestive systems. A recent nuclear dye study and x-rays were negative for amyloidosis, but further testing may be needed.  She has atrial fibrillation, treated with ablation, and has not had further episodes. Heart failure symptoms, including peripheral edema and tachycardia, began after COVID-19 in 2023. A heart monitor study showed episodes of tachycardia without atrial fibrillation.  She has stage 3 kidney disease with elevated creatinine and low GFR, managed with metoprolol and diuretics. She experiences dizziness and lightheadedness since starting Farxiga  10 mg in August.  She is hard of hearing and experiences significant fatigue, which she attributes to possible anemia, though blood work has not confirmed this. She is concerned about vitamin D, B12, and magnesium levels due to muscle cramps and eye twitching.     MEDICAL HISTORY:  Past Medical History:  Diagnosis Date   Anxiety    Arthritis    Asthma due to seasonal allergies    Complication of anesthesia    hard time waking up   Dysrhythmia 2016    ablation for a-fib in [place], no meds now   Gamekeeper's thumb of left hand    GERD (gastroesophageal reflux disease)    H/O cardiac radiofrequency ablation    Headache    History of atrial fibrillation    Polymyalgia rheumatica    Sleep apnea    mild OSA wears mouth guards    SURGICAL HISTORY: Past Surgical History:  Procedure Laterality Date   BILATERAL CARPAL TUNNEL RELEASE     CARDIAC ELECTROPHYSIOLOGY STUDY AND ABLATION  2016   in [place]   CHOLECYSTECTOMY     COLONOSCOPY     COLONOSCOPY     COLONOSCOPY     COLONOSCOPY N/A 03/18/2017   Procedure: COLONOSCOPY;  Surgeon: Golda Claudis PENNER, MD;  Location: [facility];  Service: Endoscopy;  Laterality: N/A;  930   COLONOSCOPY WITH PROPOFOL  N/A 05/16/2022   Procedure: COLONOSCOPY WITH PROPOFOL ;  Surgeon: Eartha Angelia Sieving, MD;  Location: [facility];  Service: Gastroenterology;  Laterality: N/A;  1245 ASA 2   JOINT REPLACEMENT  2014   Right knee replacement   NASAL SINUS SURGERY     NASAL SINUS SURGERY     NECK SURGERY     POLYPECTOMY  05/16/2022   Procedure: POLYPECTOMY;  Surgeon: Eartha Angelia, Sieving, MD;  Location: [facility];  Service: Gastroenterology;;   Right Carpal tunnel release     Right knee arthroscopy     Right knee arthroscopy     Right knee fabella removal     ULNAR COLLATERAL LIGAMENT REPAIR Left 09/03/2017   Procedure: REPAIR/RECONSTRUCTION ULNA COLLATERAL LIGAMENT METACARPAL LEFT THUMB;  Surgeon: Murrell Kuba, MD;  Location: [facility];  Service: Orthopedics;  Laterality: Left;  AXILLARY BLOCK    SOCIAL HISTORY: Social History   Socioeconomic History   Marital status: Single    Spouse name: Not on file   Number of children: 0   Years of education: Not on file   Highest education level: Not on file  Occupational History   Not on file  Tobacco Use   Smoking status: Never   Smokeless tobacco: Never  Vaping Use   Vaping status: Never Used  Substance and Sexual  Activity   Alcohol use: No   Drug use: No   Sexual activity: Not on file  Other Topics Concern   Not on file  Social History Narrative   Not on file   Social Drivers of Health   Financial Resource Strain: Not on file  Food Insecurity: No Food Insecurity (04/01/2024)   Hunger Vital Sign    Worried About Running Out of Food in the Last Year: Never true    Ran Out of Food in the Last Year: Never true  Transportation Needs: No Transportation Needs (04/01/2024)   PRAPARE - Administrator, Civil Service (Medical): No    Lack of Transportation (Non-Medical): No  Physical Activity: Not on file  Stress: Not on file  Social Connections: Not on file  Intimate Partner Violence: Not At Risk (04/01/2024)   Humiliation, Afraid, Rape, and Kick questionnaire    Fear of Current or Ex-Partner: No    Emotionally Abused: No    Physically Abused: No    Sexually Abused: No    FAMILY HISTORY: Family History  Problem Relation Age of Onset   Colon cancer Father    Colon cancer Sister     ALLERGIES:  is allergic to cefdinir, montelukast, augmentin [amoxicillin-pot clavulanate], cantaloupe (diagnostic), claritin [loratadine], codeine, diltiazem, iodinated contrast media, levaquin [levofloxacin in d5w], other, penicillins, pineapple, shellfish allergy, strawberry flavoring agent (non-screening), sulfa antibiotics, azithromycin, biaxin [clarithromycin], gluten meal, lasix [furosemide], latex, and naprosyn [naproxen].  MEDICATIONS:  Current Outpatient Medications  Medication Sig Dispense Refill   acetaminophen  (TYLENOL  8 HOUR) 650 MG CR tablet Take 650 mg by mouth daily as needed for pain.     albuterol (PROVENTIL) (2.5 MG/3ML) 0.083% nebulizer solution Take 2.5 mg by nebulization daily as needed for wheezing or shortness of breath.     augmented betamethasone dipropionate (DIPROLENE-AF) 0.05 % ointment Apply 1 Application topically 2 (two) times daily as needed (skin irritation/rash).      budesonide (PULMICORT) 0.5 MG/2ML nebulizer solution 0.5 mg at bedtime. Via sinus rinse     Calcium-Vitamin D-Vitamin K (VIACTIV CALCIUM PLUS D) 650-12.5-40 MG-MCG-MCG CHEW Chew 1 tablet by mouth in the morning.     Cannabidiol POWD Apply 1 Application topically as needed (pain.). CBD Pain Rub Cream     cetirizine (ZYRTEC) 10 MG tablet Take 5 mg by mouth daily as needed for allergies.     Cholecalciferol (VITAMIN D3 GUMMIES PO) Take 2,000 Units by mouth daily with lunch.     dapagliflozin  propanediol (FARXIGA ) 10 MG TABS tablet Take 1 tablet (10 mg total) by mouth daily before breakfast. 30 tablet 5   diazepam (VALIUM) 2 MG tablet Take 2 mg by mouth as needed (meniers disease).     EPINEPHrine  (EPIPEN  2-PAK) 0.3 mg/0.3 mL IJ SOAJ injection Inject 0.3 mg into the muscle as needed for anaphylaxis.     MAGNESIUM GLYCINATE PO Take 240 mg by mouth at bedtime.  Menthol, Topical Analgesic, (BIOFREEZE) 5 % PTCH Apply topically.     metoprolol succinate (TOPROL-XL) 25 MG 24 hr tablet Take 12.5 mg by mouth daily as needed.     mupirocin ointment (BACTROBAN) 2 % Apply 1 Application topically 2 (two) times daily as needed (nasal sores).     oxymetazoline (AFRIN) 0.05 % nasal spray Place 1 spray into both nostrils 2 (two) times daily.     predniSONE (DELTASONE) 5 MG tablet Take 10 mg by mouth in the morning.     tiZANidine (ZANAFLEX) 2 MG tablet Take 2 mg by mouth as needed.     triamterene-hydrochlorothiazide (MAXZIDE-25) 37.5-25 MG tablet Take 1 tablet by mouth daily.     No current facility-administered medications for this visit.    REVIEW OF SYSTEMS:   Constitutional: Denies fevers, chills or abnormal night sweats Eyes: Denies blurriness of vision, double vision or watery eyes Ears, nose, mouth, throat, and face: Denies mucositis or sore throat Respiratory: Denies cough, dyspnea or wheezes Cardiovascular: Denies palpitation, chest discomfort or lower extremity swelling Gastrointestinal:  Denies  nausea, heartburn or change in bowel habits Skin: Denies abnormal skin rashes Lymphatics: Denies new lymphadenopathy or easy bruising Neurological:Denies numbness, tingling or new weaknesses Behavioral/Psych: Mood is stable, no new changes  All other systems were reviewed with the patient and are negative.  PHYSICAL EXAMINATION: ECOG PERFORMANCE STATUS: 0 - Asymptomatic  Vitals:   04/01/24 1336  BP: 139/64  Pulse: 88  Resp: 18  Temp: 98.1 F (36.7 C)  SpO2: 98%   Filed Weights   04/01/24 1336  Weight: 153 lb (69.4 kg)    GENERAL:alert, no distress and comfortable SKIN: skin color, texture, turgor are normal, no rashes or significant lesions EYES: normal, conjunctiva are pink and non-injected, sclera clear OROPHARYNX:no exudate, no erythema and lips, buccal mucosa, and tongue normal  NECK: supple, thyroid normal size, non-tender, without nodularity LYMPH:  no palpable lymphadenopathy in the cervical, axillary or inguinal LUNGS: clear to auscultation and percussion with normal breathing effort HEART: regular rate & rhythm and no murmurs and no lower extremity edema ABDOMEN:abdomen soft, non-tender and normal bowel sounds Musculoskeletal:no cyanosis of digits and no clubbing  PSYCH: alert & oriented x 3 with fluent speech NEURO: no focal motor/sensory deficits  Physical Exam   LABORATORY DATA:  I have reviewed the data as listed    Latest Ref Rng & Units 04/01/2024    2:11 PM  CBC  WBC 4.0 - 10.5 K/uL 10.5   Hemoglobin 12.0 - 15.0 g/dL 84.6   Hematocrit 63.9 - 46.0 % 47.6   Platelets 150 - 400 K/uL 243        Latest Ref Rng & Units 04/01/2024    2:11 PM 02/17/2024   11:42 AM 01/18/2024   11:43 AM  CMP  Glucose 70 - 99 mg/dL 870   898   BUN 8 - 23 mg/dL 12   22   Creatinine 9.55 - 1.00 mg/dL 8.91   8.92   Sodium 864 - 145 mmol/L 140   139   Potassium 3.5 - 5.1 mmol/L 3.6   3.6   Chloride 98 - 111 mmol/L 100   98   CO2 22 - 32 mmol/L 27   24   Calcium 8.9 -  10.3 mg/dL 9.8   9.4   Total Protein 6.5 - 8.1 g/dL 7.1  6.3    Total Bilirubin 0.0 - 1.2 mg/dL 0.8     Alkaline Phos 38 - 126 U/L 75  AST 15 - 41 U/L 30     ALT 0 - 44 U/L 25        RADIOGRAPHIC STUDIES: I have personally reviewed the radiological images as listed and agreed with the findings in the report. No results found.   Assessment & Plan Monoclonal gammopathy of undetermined significance (MGUS) - Her cardiologist SPEP during her workup for heart failure to rule out amyloidosis.  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  24-hour urine test was negative -will check her serum light chain level, and immunoglobin level. -Her cardiac workup was negative for amyloidosis, did not have clinical high suspicion for AL amyloidosis. - Order serum free light chain assay to evaluate light chain levels. - Order immunoglobulin panel to assess IgG levels. - Consider bone marrow biopsy if light chain levels are extremely high. - Schedule follow-up in 2-3 weeks for lab results review.  Heart failure Left-sided heart failure, possibly exacerbated by COVID-19 infection in 2023. Symptoms include leg edema and tachycardia. Managed with metoprolol and diuretics, with some symptom improvement.  Chronic kidney disease stage 2 Mildly reduced kidney function without significant proteinuria or alarming features in recent tests. - Monitor kidney function regularly.  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 Reports ongoing joint pain despite treatment. 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  Celiac disease Non-compliance to a gluten-free diet, contributing to gastrointestinal and joint symptoms. Acknowledges need for dietary adherence to manage symptoms. - Advise strict adherence to a gluten-free diet to manage symptoms.  Vitamin B12  deficiency Low-normal B12 levels with absorption concerns due to celiac disease. Currently taking B12 supplements. - Order vitamin B12 level to assess current status. - Order magnesium and calcium levels to evaluate for potential deficiencies.  Plan - Lab reviewed, suspicious for IgG kappa light chain MGUS, will check serum light chain level and immunoglobin level. - Will repeat a B12 and methylmalonic acid level - Follow-up with NP in 2 to 3 weeks to review the above lab results, and determine future follow-up.   Orders Placed This Encounter  Procedures   Kappa/lambda light chains    Standing Status:   Future    Number of Occurrences:   1    Expected Date:   04/01/2024    Expiration Date:   04/01/2025   Multiple Myeloma Panel (SPEP&IFE w/QIG)    Standing Status:   Future    Number of Occurrences:   1    Expiration Date:   04/01/2025   CBC with Differential/Platelet    Standing Status:   Future    Number of Occurrences:   1    Expected Date:   04/01/2024    Expiration Date:   04/01/2025   CMP (Cancer Center only)    Standing Status:   Future    Number of Occurrences:   1    Expected Date:   04/01/2024    Expiration Date:   04/01/2025   Vitamin B12    Standing Status:   Future    Number of Occurrences:   1    Expected Date:   04/01/2024    Expiration Date:   04/01/2025   Methylmalonic acid, serum    Standing Status:   Future    Number of Occurrences:   1    Expected Date:   04/01/2024    Expiration Date:   04/01/2025   Magnesium  Standing Status:   Future    Number of Occurrences:   1    Expected Date:   04/01/2024    Expiration Date:   04/01/2025    All questions were answered. The patient knows to call the clinic with any problems, questions or concerns. I spent 25 minutes counseling the patient face to face. The total time spent in the appointment was 30 minutes including review of chart and various tests results, discussions about plan of care and coordination  of care plan.     Onita Mattock, MD 04/01/2024 4:00 PM

## 2024-04-01 ENCOUNTER — Inpatient Hospital Stay

## 2024-04-01 ENCOUNTER — Inpatient Hospital Stay: Attending: Hematology | Admitting: Hematology

## 2024-04-01 ENCOUNTER — Encounter: Payer: Self-pay | Admitting: Hematology

## 2024-04-01 VITALS — BP 139/64 | HR 88 | Temp 98.1°F | Resp 18 | Ht 65.35 in | Wt 153.0 lb

## 2024-04-01 DIAGNOSIS — Z8616 Personal history of COVID-19: Secondary | ICD-10-CM | POA: Diagnosis not present

## 2024-04-01 DIAGNOSIS — Z885 Allergy status to narcotic agent status: Secondary | ICD-10-CM | POA: Insufficient documentation

## 2024-04-01 DIAGNOSIS — Z880 Allergy status to penicillin: Secondary | ICD-10-CM | POA: Diagnosis not present

## 2024-04-01 DIAGNOSIS — E538 Deficiency of other specified B group vitamins: Secondary | ICD-10-CM | POA: Insufficient documentation

## 2024-04-01 DIAGNOSIS — N182 Chronic kidney disease, stage 2 (mild): Secondary | ICD-10-CM | POA: Diagnosis not present

## 2024-04-01 DIAGNOSIS — Z800 Family history of malignant neoplasm of digestive organs: Secondary | ICD-10-CM | POA: Insufficient documentation

## 2024-04-01 DIAGNOSIS — Z7951 Long term (current) use of inhaled steroids: Secondary | ICD-10-CM | POA: Insufficient documentation

## 2024-04-01 DIAGNOSIS — Z882 Allergy status to sulfonamides status: Secondary | ICD-10-CM | POA: Insufficient documentation

## 2024-04-01 DIAGNOSIS — Z91040 Latex allergy status: Secondary | ICD-10-CM | POA: Diagnosis not present

## 2024-04-01 DIAGNOSIS — K900 Celiac disease: Secondary | ICD-10-CM | POA: Diagnosis not present

## 2024-04-01 DIAGNOSIS — J45909 Unspecified asthma, uncomplicated: Secondary | ICD-10-CM | POA: Diagnosis not present

## 2024-04-01 DIAGNOSIS — Z881 Allergy status to other antibiotic agents status: Secondary | ICD-10-CM | POA: Insufficient documentation

## 2024-04-01 DIAGNOSIS — D472 Monoclonal gammopathy: Secondary | ICD-10-CM

## 2024-04-01 DIAGNOSIS — M069 Rheumatoid arthritis, unspecified: Secondary | ICD-10-CM | POA: Insufficient documentation

## 2024-04-01 DIAGNOSIS — Z7984 Long term (current) use of oral hypoglycemic drugs: Secondary | ICD-10-CM | POA: Diagnosis not present

## 2024-04-01 DIAGNOSIS — M353 Polymyalgia rheumatica: Secondary | ICD-10-CM | POA: Insufficient documentation

## 2024-04-01 DIAGNOSIS — Z888 Allergy status to other drugs, medicaments and biological substances status: Secondary | ICD-10-CM | POA: Diagnosis not present

## 2024-04-01 DIAGNOSIS — L4050 Arthropathic psoriasis, unspecified: Secondary | ICD-10-CM | POA: Diagnosis not present

## 2024-04-01 DIAGNOSIS — Z7952 Long term (current) use of systemic steroids: Secondary | ICD-10-CM | POA: Diagnosis not present

## 2024-04-01 DIAGNOSIS — I4891 Unspecified atrial fibrillation: Secondary | ICD-10-CM | POA: Insufficient documentation

## 2024-04-01 DIAGNOSIS — Z79899 Other long term (current) drug therapy: Secondary | ICD-10-CM | POA: Insufficient documentation

## 2024-04-01 DIAGNOSIS — Z886 Allergy status to analgesic agent status: Secondary | ICD-10-CM | POA: Insufficient documentation

## 2024-04-01 LAB — CBC WITH DIFFERENTIAL/PLATELET
Abs Immature Granulocytes: 0.1 K/uL — ABNORMAL HIGH (ref 0.00–0.07)
Basophils Absolute: 0.1 K/uL (ref 0.0–0.1)
Basophils Relative: 1 %
Eosinophils Absolute: 0.1 K/uL (ref 0.0–0.5)
Eosinophils Relative: 1 %
HCT: 47.6 % — ABNORMAL HIGH (ref 36.0–46.0)
Hemoglobin: 15.3 g/dL — ABNORMAL HIGH (ref 12.0–15.0)
Immature Granulocytes: 1 %
Lymphocytes Relative: 10 %
Lymphs Abs: 1 K/uL (ref 0.7–4.0)
MCH: 30.4 pg (ref 26.0–34.0)
MCHC: 32.1 g/dL (ref 30.0–36.0)
MCV: 94.4 fL (ref 80.0–100.0)
Monocytes Absolute: 0.5 K/uL (ref 0.1–1.0)
Monocytes Relative: 5 %
Neutro Abs: 8.7 K/uL — ABNORMAL HIGH (ref 1.7–7.7)
Neutrophils Relative %: 82 %
Platelets: 243 K/uL (ref 150–400)
RBC: 5.04 MIL/uL (ref 3.87–5.11)
RDW: 13.8 % (ref 11.5–15.5)
WBC: 10.5 K/uL (ref 4.0–10.5)
nRBC: 0 % (ref 0.0–0.2)

## 2024-04-01 LAB — CMP (CANCER CENTER ONLY)
ALT: 25 U/L (ref 0–44)
AST: 30 U/L (ref 15–41)
Albumin: 4.3 g/dL (ref 3.5–5.0)
Alkaline Phosphatase: 75 U/L (ref 38–126)
Anion gap: 12 (ref 5–15)
BUN: 12 mg/dL (ref 8–23)
CO2: 27 mmol/L (ref 22–32)
Calcium: 9.8 mg/dL (ref 8.9–10.3)
Chloride: 100 mmol/L (ref 98–111)
Creatinine: 1.08 mg/dL — ABNORMAL HIGH (ref 0.44–1.00)
GFR, Estimated: 55 mL/min — ABNORMAL LOW (ref >60)
Glucose, Bld: 129 mg/dL — ABNORMAL HIGH (ref 70–99)
Potassium: 3.6 mmol/L (ref 3.5–5.1)
Sodium: 140 mmol/L (ref 135–145)
Total Bilirubin: 0.8 mg/dL (ref 0.0–1.2)
Total Protein: 7.1 g/dL (ref 6.5–8.1)

## 2024-04-01 LAB — MAGNESIUM: Magnesium: 2.5 mg/dL — ABNORMAL HIGH (ref 1.7–2.4)

## 2024-04-01 LAB — VITAMIN B12: Vitamin B-12: 907 pg/mL (ref 180–914)

## 2024-04-04 LAB — KAPPA/LAMBDA LIGHT CHAINS
Kappa free light chain: 15.4 mg/L (ref 3.3–19.4)
Kappa, lambda light chain ratio: 1.13 (ref 0.26–1.65)
Lambda free light chains: 13.6 mg/L (ref 5.7–26.3)

## 2024-04-06 LAB — MULTIPLE MYELOMA PANEL, SERUM
Albumin SerPl Elph-Mcnc: 3.7 g/dL (ref 2.9–4.4)
Albumin/Glob SerPl: 1.2 (ref 0.7–1.7)
Alpha 1: 0.3 g/dL (ref 0.0–0.4)
Alpha2 Glob SerPl Elph-Mcnc: 0.9 g/dL (ref 0.4–1.0)
B-Globulin SerPl Elph-Mcnc: 1.1 g/dL (ref 0.7–1.3)
Gamma Glob SerPl Elph-Mcnc: 0.8 g/dL (ref 0.4–1.8)
Globulin, Total: 3.1 g/dL (ref 2.2–3.9)
IgA: 84 mg/dL — ABNORMAL LOW (ref 87–352)
IgG (Immunoglobin G), Serum: 799 mg/dL (ref 586–1602)
IgM (Immunoglobulin M), Srm: 91 mg/dL (ref 26–217)
M Protein SerPl Elph-Mcnc: 0.2 g/dL — ABNORMAL HIGH
Total Protein ELP: 6.8 g/dL (ref 6.0–8.5)

## 2024-04-07 ENCOUNTER — Other Ambulatory Visit: Payer: Self-pay | Admitting: *Deleted

## 2024-04-07 ENCOUNTER — Encounter: Payer: Self-pay | Admitting: *Deleted

## 2024-04-09 LAB — METHYLMALONIC ACID, SERUM: Methylmalonic Acid, Quantitative: 190 nmol/L (ref 0–378)

## 2024-04-14 ENCOUNTER — Inpatient Hospital Stay: Attending: Hematology | Admitting: Oncology

## 2024-04-14 DIAGNOSIS — E538 Deficiency of other specified B group vitamins: Secondary | ICD-10-CM

## 2024-04-14 DIAGNOSIS — D472 Monoclonal gammopathy: Secondary | ICD-10-CM | POA: Diagnosis not present

## 2024-04-14 NOTE — Progress Notes (Signed)
 [facility]   [phone] [phone]   Clinic New Consult Note   Patient Care Team: Toy Laurance POUR, MD as PCP - General (Internal Medicine) Stacia Diannah SQUIBB, MD as PCP - Cardiology (Cardiology) 04/14/2024  I connected with Caitlin Barnes on 04/14/24 at 11:15 AM EST by telephone visit and verified that I am speaking with the correct person using two identifiers.   I discussed the limitations, risks, security and privacy concerns of performing an evaluation and management service by telemedicine and the availability of in-person appointments. I also discussed with the patient that there may be a patient responsible charge related to this service. The patient expressed understanding and agreed to proceed.   Other persons participating in the visit and their role in the encounter: NP, Patient    Patient's location: Home  Provider's location: Clinic    CHIEF COMPLAINTS/PURPOSE OF CONSULTATION:  MGUS  REFERRING PHYSICIAN: Mallipeddi, Vishnu P, MD   Discussed the use of AI scribe software for clinical note transcription with the patient, who gave verbal consent to proceed.  History of Present Illness Caitlin Barnes is a 68 year old female who presents for evaluation of an abnormal protein in her blood. She was referred by Dr. Charity for evaluation of possible amyloidosis.  She has a history of 'stiff heart' and has undergone carpal tunnel surgery on both wrists. She experiences left-sided heart failure, kidney dysfunction, and issues with her central nervous and digestive systems. A recent nuclear dye study and x-rays were negative for amyloidosis, but further testing may be needed.  She has atrial fibrillation, treated with ablation, and has not had further episodes. Heart failure symptoms, including peripheral edema and tachycardia, began after COVID-19 in 2023. A heart monitor study showed episodes of tachycardia without atrial  fibrillation.  She has stage 3 kidney disease with elevated creatinine and low GFR, managed with metoprolol and diuretics. She experiences dizziness and lightheadedness since starting Farxiga  10 mg in August.  Patient continues to have significant fatigue and allover body pain.  Reports she has been seen for physical therapy and on several occasions has been asked to pull on bands/lift weights and popped a tendon in her upper arm.  She is unsure of why this is happening.  She developed a knot and has trouble moving that arm.  Reports her brother has CLL.     MEDICAL HISTORY:  Past Medical History:  Diagnosis Date   Anxiety    Arthritis    Asthma due to seasonal allergies    Complication of anesthesia    hard time waking up   Dysrhythmia 2016   ablation for a-fib in [place], no meds now   Gamekeeper's thumb of left hand    GERD (gastroesophageal reflux disease)    H/O cardiac radiofrequency ablation    Headache    History of atrial fibrillation    Polymyalgia rheumatica    Sleep apnea    mild OSA wears mouth guards    SURGICAL HISTORY: Past Surgical History:  Procedure Laterality Date   BILATERAL CARPAL TUNNEL RELEASE     CARDIAC ELECTROPHYSIOLOGY STUDY AND ABLATION  2016   in [place]   CHOLECYSTECTOMY     COLONOSCOPY     COLONOSCOPY     COLONOSCOPY     COLONOSCOPY N/A 03/18/2017   Procedure: COLONOSCOPY;  Surgeon: Golda Claudis PENNER, MD;  Location: [facility];  Service: Endoscopy;  Laterality: N/A;  930   COLONOSCOPY WITH PROPOFOL  N/A 05/16/2022  Procedure: COLONOSCOPY WITH PROPOFOL ;  Surgeon: Eartha Angelia Sieving, MD;  Location: [facility];  Service: Gastroenterology;  Laterality: N/A;  1245 ASA 2   JOINT REPLACEMENT  2014   Right knee replacement   NASAL SINUS SURGERY     NASAL SINUS SURGERY     NECK SURGERY     POLYPECTOMY  05/16/2022   Procedure: POLYPECTOMY;  Surgeon: Eartha Angelia, Sieving, MD;  Location: [facility];  Service:  Gastroenterology;;   Right Carpal tunnel release     Right knee arthroscopy     Right knee arthroscopy     Right knee fabella removal     ULNAR COLLATERAL LIGAMENT REPAIR Left 09/03/2017   Procedure: REPAIR/RECONSTRUCTION ULNA COLLATERAL LIGAMENT METACARPAL LEFT THUMB;  Surgeon: Murrell Kuba, MD;  Location: [facility];  Service: Orthopedics;  Laterality: Left;  AXILLARY BLOCK    SOCIAL HISTORY: Social History   Socioeconomic History   Marital status: Single    Spouse name: Not on file   Number of children: 0   Years of education: Not on file   Highest education level: Not on file  Occupational History   Not on file  Tobacco Use   Smoking status: Never   Smokeless tobacco: Never  Vaping Use   Vaping status: Never Used  Substance and Sexual Activity   Alcohol use: No   Drug use: No   Sexual activity: Not on file  Other Topics Concern   Not on file  Social History Narrative   Not on file   Social Drivers of Health   Financial Resource Strain: Not on file  Food Insecurity: No Food Insecurity (04/01/2024)   Hunger Vital Sign    Worried About Running Out of Food in the Last Year: Never true    Ran Out of Food in the Last Year: Never true  Transportation Needs: No Transportation Needs (04/01/2024)   PRAPARE - Administrator, Civil Service (Medical): No    Lack of Transportation (Non-Medical): No  Physical Activity: Not on file  Stress: Not on file  Social Connections: Not on file  Intimate Partner Violence: Not At Risk (04/01/2024)   Humiliation, Afraid, Rape, and Kick questionnaire    Fear of Current or Ex-Partner: No    Emotionally Abused: No    Physically Abused: No    Sexually Abused: No    FAMILY HISTORY: Family History  Problem Relation Age of Onset   Colon cancer Father    Colon cancer Sister    Leukemia Brother     ALLERGIES:  is allergic to cefdinir, gatifloxacin, montelukast, augmentin [amoxicillin-pot clavulanate], cantaloupe  (diagnostic), ciprofloxacin, claritin [loratadine], codeine, diltiazem, iodinated contrast media, levaquin [levofloxacin in d5w], other, penicillins, pineapple, shellfish allergy, strawberry flavoring agent (non-screening), sulfa antibiotics, wound dressings, azithromycin, biaxin [clarithromycin], gluten meal, lasix [furosemide], latex, and naprosyn [naproxen].  MEDICATIONS:  Current Outpatient Medications  Medication Sig Dispense Refill   acetaminophen  (TYLENOL  8 HOUR) 650 MG CR tablet Take 650 mg by mouth daily as needed for pain.     albuterol (PROVENTIL) (2.5 MG/3ML) 0.083% nebulizer solution Take 2.5 mg by nebulization daily as needed for wheezing or shortness of breath.     augmented betamethasone dipropionate (DIPROLENE-AF) 0.05 % ointment Apply 1 Application topically 2 (two) times daily as needed (skin irritation/rash).     budesonide (PULMICORT) 0.5 MG/2ML nebulizer solution 0.5 mg at bedtime. Via sinus rinse     bumetanide (BUMEX) 1 MG tablet Take 1 mg by mouth daily.  Calcium-Vitamin D-Vitamin K (VIACTIV CALCIUM PLUS D) 650-12.5-40 MG-MCG-MCG CHEW Chew 1 tablet by mouth in the morning.     Cannabidiol POWD Apply 1 Application topically as needed (pain.). CBD Pain Rub Cream     cetirizine (ZYRTEC) 10 MG tablet Take 5 mg by mouth daily as needed for allergies.     Cholecalciferol (VITAMIN D3 GUMMIES PO) Take 2,000 Units by mouth daily with lunch.     dapagliflozin  propanediol (FARXIGA ) 10 MG TABS tablet Take 1 tablet (10 mg total) by mouth daily before breakfast. 30 tablet 5   diazepam (VALIUM) 2 MG tablet Take 2 mg by mouth as needed (meniers disease).     EPINEPHrine  (EPIPEN  2-PAK) 0.3 mg/0.3 mL IJ SOAJ injection Inject 0.3 mg into the muscle as needed for anaphylaxis.     MAGNESIUM GLYCINATE PO Take 240 mg by mouth at bedtime.     Menthol, Topical Analgesic, (BIOFREEZE) 5 % PTCH Apply topically.     metoprolol succinate (TOPROL-XL) 25 MG 24 hr tablet Take 12.5 mg by mouth daily as  needed.     mupirocin ointment (BACTROBAN) 2 % Apply 1 Application topically 2 (two) times daily as needed (nasal sores).     oxymetazoline (AFRIN) 0.05 % nasal spray Place 1 spray into both nostrils 2 (two) times daily.     predniSONE (DELTASONE) 5 MG tablet Take 10 mg by mouth in the morning.     tiZANidine (ZANAFLEX) 2 MG tablet Take 2 mg by mouth as needed.     triamterene-hydrochlorothiazide (MAXZIDE-25) 37.5-25 MG tablet Take 1 tablet by mouth daily. (Patient not taking: Reported on 04/14/2024)     No current facility-administered medications for this visit.    REVIEW OF SYSTEMS:   Review of Systems  Constitutional:  Positive for malaise/fatigue.  Cardiovascular:  Positive for palpitations.  Gastrointestinal:  Positive for diarrhea.  Musculoskeletal:  Positive for falls, joint pain and myalgias.  Neurological:  Positive for dizziness.    PHYSICAL EXAMINATION: ECOG PERFORMANCE STATUS: 0 - Asymptomatic  There were no vitals filed for this visit.  There were no vitals filed for this visit.   Physical Exam Neurological:     Mental Status: She is alert and oriented to person, place, and time.    Physical Exam   LABORATORY DATA:  I have reviewed the data as listed    Latest Ref Rng & Units 04/01/2024    2:11 PM  CBC  WBC 4.0 - 10.5 K/uL 10.5   Hemoglobin 12.0 - 15.0 g/dL 84.6   Hematocrit 63.9 - 46.0 % 47.6   Platelets 150 - 400 K/uL 243        Latest Ref Rng & Units 04/01/2024    2:11 PM 02/17/2024   11:42 AM 01/18/2024   11:43 AM  CMP  Glucose 70 - 99 mg/dL 870   898   BUN 8 - 23 mg/dL 12   22   Creatinine 9.55 - 1.00 mg/dL 8.91   8.92   Sodium 864 - 145 mmol/L 140   139   Potassium 3.5 - 5.1 mmol/L 3.6   3.6   Chloride 98 - 111 mmol/L 100   98   CO2 22 - 32 mmol/L 27   24   Calcium 8.9 - 10.3 mg/dL 9.8   9.4   Total Protein 6.5 - 8.1 g/dL 7.1  6.3    Total Bilirubin 0.0 - 1.2 mg/dL 0.8     Alkaline Phos 38 - 126 U/L 75  AST 15 - 41 U/L 30     ALT 0  - 44 U/L 25        RADIOGRAPHIC STUDIES: I have personally reviewed the radiological images as listed and agreed with the findings in the report. No results found.  Assessment & Plan Monoclonal gammopathy of undetermined significance (MGUS) - Her cardiologist SPEP during her workup for heart failure to rule out amyloidosis.  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  24-hour urine test was negative -will check her serum light chain level, and immunoglobin level.  -Her cardiac workup was negative for amyloidosis, did not have clinical high suspicion for AL amyloidosis. - Multiple myeloma workup I IgG monoclonal protein with kappa light chain specificity.  M spike 0.2. KLLC within normal limits.  No crab criteria (calcium, hemoglobin, creatinine all baseline).  Patient has allover body pain including bones. - Discussed bone scan prior to next visit. - Return to clinic every 6 months with labs and CMP. - No indication for bone marrow at this time.  Heart failure Left-sided heart failure, possibly exacerbated by COVID-19 infection in 2023. Symptoms include leg edema and tachycardia. Managed with metoprolol and diuretics, with some symptom improvement.  Chronic kidney disease stage 2 Mildly reduced kidney function without significant proteinuria or alarming features in recent tests. - Monitor kidney function regularly.  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 Reports ongoing joint pain despite treatment. 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  Celiac disease Non-compliance to a gluten-free diet, contributing to gastrointestinal and joint symptoms. Acknowledges need for dietary adherence to manage symptoms. - Advise strict adherence to a gluten-free diet to manage symptoms.  Vitamin B12 deficiency Low-normal B12 levels  with absorption concerns due to celiac disease. Currently taking B12 supplements. - B12 907, MMA normal.  Plan - Return to clinic in 6 months with labs a few days before.  She will need to get a bone scan as well. - Continue to monitor myeloma labs every 6 months.  Orders Placed This Encounter  Procedures   DG Bone Survey Met    Standing Status:   Future    Expected Date:   05/14/2024    Expiration Date:   04/14/2025    Reason for Exam (SYMPTOM  OR DIAGNOSIS REQUIRED):   staging myeloma    Preferred imaging location?:   [facility]   Methylmalonic acid, serum    Standing Status:   Future    Expected Date:   10/12/2024    Expiration Date:   01/10/2025   Vitamin B12    Standing Status:   Future    Expected Date:   10/12/2024    Expiration Date:   01/10/2025   CMP (Cancer Center only)    Standing Status:   Future    Expected Date:   10/12/2024    Expiration Date:   01/10/2025   CBC with Differential/Platelet    Standing Status:   Future    Expected Date:   10/12/2024    Expiration Date:   01/10/2025   Multiple Myeloma Panel (SPEP&IFE w/QIG)    Standing Status:   Future    Expected Date:   10/12/2024    Expiration Date:   01/10/2025   Kappa/lambda light chains    Standing Status:   Future    Expected Date:   10/12/2024    Expiration Date:  01/10/2025    All questions were answered. The patient knows to call the clinic with any problems, questions or concerns.  I provided 18 minutes of non face-to-face telephone visit time during this encounter, and > 50% was spent counseling as documented under my assessment & plan.     Delon FORBES Hope, NP 04/14/2024 11:29 AM

## 2024-06-06 ENCOUNTER — Encounter: Payer: Self-pay | Admitting: *Deleted

## 2024-07-29 ENCOUNTER — Ambulatory Visit: Admitting: Nurse Practitioner

## 2024-10-12 ENCOUNTER — Inpatient Hospital Stay

## 2024-10-19 ENCOUNTER — Inpatient Hospital Stay: Admitting: Oncology
